# Patient Record
Sex: Female | Born: 1970 | Race: Black or African American | Hispanic: No | Marital: Single | Smoking: Never smoker
Health system: Southern US, Community
[De-identification: ages and names within clinical notes are randomized; demographics above are authoritative.]

## PROBLEM LIST (undated history)

## (undated) DIAGNOSIS — Z9221 Personal history of antineoplastic chemotherapy: Secondary | ICD-10-CM

## (undated) DIAGNOSIS — Z923 Personal history of irradiation: Secondary | ICD-10-CM

## (undated) DIAGNOSIS — C50919 Malignant neoplasm of unspecified site of unspecified female breast: Secondary | ICD-10-CM

## (undated) DIAGNOSIS — Z8589 Personal history of malignant neoplasm of other organs and systems: Secondary | ICD-10-CM

## (undated) DIAGNOSIS — Z801 Family history of malignant neoplasm of trachea, bronchus and lung: Secondary | ICD-10-CM

## (undated) DIAGNOSIS — D649 Anemia, unspecified: Secondary | ICD-10-CM

## (undated) DIAGNOSIS — J302 Other seasonal allergic rhinitis: Secondary | ICD-10-CM

## (undated) DIAGNOSIS — T8859XA Other complications of anesthesia, initial encounter: Secondary | ICD-10-CM

## (undated) DIAGNOSIS — C801 Malignant (primary) neoplasm, unspecified: Secondary | ICD-10-CM

## (undated) DIAGNOSIS — Z807 Family history of other malignant neoplasms of lymphoid, hematopoietic and related tissues: Secondary | ICD-10-CM

## (undated) DIAGNOSIS — Z803 Family history of malignant neoplasm of breast: Secondary | ICD-10-CM

## (undated) DIAGNOSIS — T4145XA Adverse effect of unspecified anesthetic, initial encounter: Secondary | ICD-10-CM

## (undated) DIAGNOSIS — E039 Hypothyroidism, unspecified: Secondary | ICD-10-CM

## (undated) DIAGNOSIS — Z8042 Family history of malignant neoplasm of prostate: Secondary | ICD-10-CM

## (undated) DIAGNOSIS — Z8052 Family history of malignant neoplasm of bladder: Secondary | ICD-10-CM

## (undated) DIAGNOSIS — K219 Gastro-esophageal reflux disease without esophagitis: Secondary | ICD-10-CM

## (undated) DIAGNOSIS — I10 Essential (primary) hypertension: Secondary | ICD-10-CM

## (undated) HISTORY — DX: Personal history of malignant neoplasm of other organs and systems: Z85.89

## (undated) HISTORY — PX: CHOLECYSTECTOMY: SHX55

## (undated) HISTORY — PX: TUBAL LIGATION: SHX77

## (undated) HISTORY — DX: Malignant neoplasm of unspecified site of unspecified female breast: C50.919

## (undated) HISTORY — DX: Family history of malignant neoplasm of prostate: Z80.42

## (undated) HISTORY — PX: NECK LESION BIOPSY: SHX2078

## (undated) HISTORY — DX: Family history of malignant neoplasm of trachea, bronchus and lung: Z80.1

## (undated) HISTORY — DX: Family history of malignant neoplasm of bladder: Z80.52

## (undated) HISTORY — DX: Essential (primary) hypertension: I10

## (undated) HISTORY — DX: Family history of other malignant neoplasms of lymphoid, hematopoietic and related tissues: Z80.7

## (undated) HISTORY — DX: Family history of malignant neoplasm of breast: Z80.3

---

## 1995-01-02 DIAGNOSIS — C801 Malignant (primary) neoplasm, unspecified: Secondary | ICD-10-CM

## 1995-01-02 HISTORY — PX: NECK SURGERY: SHX720

## 1995-01-02 HISTORY — DX: Malignant (primary) neoplasm, unspecified: C80.1

## 2016-02-01 ENCOUNTER — Other Ambulatory Visit: Payer: Self-pay | Admitting: Specialist

## 2016-02-01 DIAGNOSIS — N6320 Unspecified lump in the left breast, unspecified quadrant: Secondary | ICD-10-CM

## 2016-02-01 DIAGNOSIS — R928 Other abnormal and inconclusive findings on diagnostic imaging of breast: Secondary | ICD-10-CM

## 2016-02-09 ENCOUNTER — Ambulatory Visit
Admission: RE | Admit: 2016-02-09 | Discharge: 2016-02-09 | Disposition: A | Discharge status: Home / Self Care | Payer: BLUE CROSS/BLUE SHIELD | Source: Ambulatory Visit | Attending: Specialist | Admitting: Specialist

## 2016-02-09 ENCOUNTER — Other Ambulatory Visit: Payer: Self-pay | Admitting: Specialist

## 2016-02-09 DIAGNOSIS — N6320 Unspecified lump in the left breast, unspecified quadrant: Secondary | ICD-10-CM

## 2016-02-09 DIAGNOSIS — R928 Other abnormal and inconclusive findings on diagnostic imaging of breast: Secondary | ICD-10-CM

## 2016-02-13 ENCOUNTER — Telehealth: Payer: Self-pay | Admitting: *Deleted

## 2016-02-13 NOTE — Telephone Encounter (Signed)
Confirmed [facility] for 1215 at 02/15/16 .  Instructions and contact information given.

## 2016-02-14 ENCOUNTER — Other Ambulatory Visit: Payer: Self-pay | Admitting: *Deleted

## 2016-02-14 DIAGNOSIS — C50412 Malignant neoplasm of upper-outer quadrant of left female breast: Secondary | ICD-10-CM | POA: Insufficient documentation

## 2016-02-14 DIAGNOSIS — Z171 Estrogen receptor negative status [ER-]: Principal | ICD-10-CM

## 2016-02-15 ENCOUNTER — Ambulatory Visit (HOSPITAL_BASED_OUTPATIENT_CLINIC_OR_DEPARTMENT_OTHER): Payer: BLUE CROSS/BLUE SHIELD | Admitting: Hematology and Oncology

## 2016-02-15 ENCOUNTER — Other Ambulatory Visit (HOSPITAL_BASED_OUTPATIENT_CLINIC_OR_DEPARTMENT_OTHER): Payer: BLUE CROSS/BLUE SHIELD

## 2016-02-15 ENCOUNTER — Ambulatory Visit: Payer: Self-pay | Admitting: Surgery

## 2016-02-15 ENCOUNTER — Encounter: Payer: Self-pay | Admitting: Hematology and Oncology

## 2016-02-15 ENCOUNTER — Ambulatory Visit: Payer: BLUE CROSS/BLUE SHIELD | Attending: Surgery | Admitting: Physical Therapy

## 2016-02-15 ENCOUNTER — Encounter: Payer: Self-pay | Admitting: Physical Therapy

## 2016-02-15 ENCOUNTER — Ambulatory Visit
Admission: RE | Admit: 2016-02-15 | Discharge: 2016-02-15 | Disposition: A | Discharge status: Home / Self Care | Payer: BLUE CROSS/BLUE SHIELD | Source: Ambulatory Visit | Attending: Radiation Oncology | Admitting: Radiation Oncology

## 2016-02-15 VITALS — BP 133/68 | HR 89 | Temp 98.0°F | Resp 20 | Ht 69.0 in | Wt 291.5 lb

## 2016-02-15 DIAGNOSIS — Z171 Estrogen receptor negative status [ER-]: Secondary | ICD-10-CM

## 2016-02-15 DIAGNOSIS — C50412 Malignant neoplasm of upper-outer quadrant of left female breast: Secondary | ICD-10-CM | POA: Insufficient documentation

## 2016-02-15 DIAGNOSIS — M25612 Stiffness of left shoulder, not elsewhere classified: Secondary | ICD-10-CM | POA: Insufficient documentation

## 2016-02-15 DIAGNOSIS — Z170 Estrogen receptor positive status [ER+]: Secondary | ICD-10-CM | POA: Diagnosis not present

## 2016-02-15 DIAGNOSIS — R293 Abnormal posture: Secondary | ICD-10-CM | POA: Diagnosis not present

## 2016-02-15 LAB — COMPREHENSIVE METABOLIC PANEL
ALBUMIN: 3.9 g/dL (ref 3.5–5.0)
ALT: 20 U/L (ref 0–55)
AST: 47 U/L — AB (ref 5–34)
Alkaline Phosphatase: 98 U/L (ref 40–150)
Anion Gap: 13 mEq/L — ABNORMAL HIGH (ref 3–11)
BILIRUBIN TOTAL: 0.39 mg/dL (ref 0.20–1.20)
BUN: 15.6 mg/dL (ref 7.0–26.0)
CO2: 26 meq/L (ref 22–29)
Calcium: 9.8 mg/dL (ref 8.4–10.4)
Chloride: 102 mEq/L (ref 98–109)
Creatinine: 0.9 mg/dL (ref 0.6–1.1)
EGFR: 88 mL/min/{1.73_m2} — AB (ref >90)
GLUCOSE: 95 mg/dL (ref 70–140)
Potassium: 3.6 mEq/L (ref 3.5–5.1)
SODIUM: 141 meq/L (ref 136–145)
TOTAL PROTEIN: 8 g/dL (ref 6.4–8.3)

## 2016-02-15 LAB — CBC WITH DIFFERENTIAL/PLATELET
BASO%: 0.8 % (ref 0.0–2.0)
Basophils Absolute: 0.1 10*3/uL (ref 0.0–0.1)
EOS ABS: 0.1 10*3/uL (ref 0.0–0.5)
EOS%: 2.1 % (ref 0.0–7.0)
HCT: 40.9 % (ref 34.8–46.6)
HEMOGLOBIN: 13.3 g/dL (ref 11.6–15.9)
LYMPH%: 16.7 % (ref 14.0–49.7)
MCH: 26.5 pg (ref 25.1–34.0)
MCHC: 32.4 g/dL (ref 31.5–36.0)
MCV: 81.8 fL (ref 79.5–101.0)
MONO#: 0.5 10*3/uL (ref 0.1–0.9)
MONO%: 7.6 % (ref 0.0–14.0)
NEUT%: 72.8 % (ref 38.4–76.8)
NEUTROS ABS: 4.7 10*3/uL (ref 1.5–6.5)
Platelets: 315 10*3/uL (ref 145–400)
RBC: 5 10*6/uL (ref 3.70–5.45)
RDW: 16.4 % — AB (ref 11.2–14.5)
WBC: 6.5 10*3/uL (ref 3.9–10.3)
lymph#: 1.1 10*3/uL (ref 0.9–3.3)

## 2016-02-15 NOTE — Therapy (Addendum)
[place], [place], [address] Phone: [phone]   Fax:  [phone]  Physical Therapy Evaluation  Patient Details  Name: Magdaline Zollars MRN: [id number] Date of Birth: 1970-09-20 Referring Provider: Dr. [name]  Encounter Date: 02/15/2016      PT End of Session - 02/15/16 1529    Visit Number 1   Number of Visits 1   PT Start Time 3704   PT Stop Time 8889  Also saw pt from 1339-1400 and from 1417-1428 for a total of 36 minutes   PT Time Calculation (min) 4 min   Activity Tolerance Patient tolerated treatment well   Behavior During Therapy [facility] for tasks assessed/performed      Past Medical History:  Diagnosis Date  . History of head and neck cancer   . Hypertension     Past Surgical History:  Procedure Laterality Date  . CHOLECYSTECTOMY    . TUBAL LIGATION      There were no vitals filed for this visit.       Subjective Assessment - 02/15/16 1530    Subjective Patient reports she is here today to be seen by her medical team for her newly diagnosed left breast cancer.   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 It measures 1.8 cm and is located in the upper outer quadrant and has a Ki67 of 40%. She had a radical neck dissection in 1998 which resulted in significant loss of cervical and left shoulder ROM and function.   Patient Stated Goals Reduce lymphedema risk and learn post op shoulder ROM HEP            [facility] PT Assessment - 02/15/16 0001      Assessment   Medical Diagnosis Left breast cancer   Referring Provider Dr. [name] [place]   Onset Date/Surgical Date 01/24/16   Hand Dominance Left   Prior Therapy none     Precautions   Precautions Other (comment)   Precaution Comments Active cancer     Restrictions   Weight Bearing Restrictions No      Balance Screen   Has the patient fallen in the past 6 months No   Has the patient had a decrease in activity level because of a fear of falling?  No   Is the patient reluctant to leave their home because of a fear of falling?  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   Vocation Requirements repetitive left arm activities   Leisure She does not exercise     Cognition   Overall Cognitive Status Within Functional Limits for tasks assessed     Posture/Postural Control   Posture/Postural Control Postural limitations   Postural Limitations Rounded Shoulders;Forward head   Posture Comments Compensatory motions with left arm activities     ROM / Strength   AROM / PROM / Strength AROM;Strength     AROM   AROM Assessment Site Shoulder;Cervical   Right/Left Shoulder Right;Left   Right Shoulder Extension 49 Degrees   Right Shoulder Flexion 145 Degrees   Right Shoulder ABduction 145 Degrees  Right Shoulder Internal Rotation 66 Degrees   Right Shoulder External Rotation 88 Degrees   Left Shoulder Extension 48 Degrees   Left Shoulder Flexion 85 Degrees   Left Shoulder ABduction 55 Degrees   Left Shoulder Internal Rotation 47 Degrees   Left Shoulder External Rotation 64 Degrees   Cervical Flexion WNL   Cervical Extension 50% limited   Cervical - Right Side Bend 50% limited   Cervical - Left Side Bend 50% limited   Cervical - Right Rotation 50% limited   Cervical - Left Rotation 25% limited     Strength   Overall Strength Unable to assess  Did not assess left upper extremity due to poor ROM    Overall Strength Comments Left glenohumeral joint sits anteriorly           LYMPHEDEMA/ONCOLOGY  QUESTIONNAIRE - 02/15/16 1527      Type   Cancer Type Left breast cancer     Lymphedema Assessments   Lymphedema Assessments Upper extremities     Right Upper Extremity Lymphedema   10 cm Proximal to Olecranon Process 47.2 cm   Olecranon Process 32 cm   10 cm Proximal to Ulnar Styloid Process 26.9 cm   Just Proximal to Ulnar Styloid Process 19.2 cm   Across Hand at [employer] 22.4 cm   At [place] of 2nd Digit 6.9 cm     Left Upper Extremity Lymphedema   10 cm Proximal to Olecranon Process 47.7 cm   Olecranon Process 34.1 cm   10 cm Proximal to Ulnar Styloid Process 28 cm   Just Proximal to Ulnar Styloid Process 19.9 cm   Across Hand at [employer] 23.1 cm   At [place] of 2nd Digit 6.8 cm                 Patient was instructed today in a home exercise program today for post op shoulder range of motion. These included active assist shoulder flexion in sitting, scapular retraction, wall walking with shoulder abduction, and hands behind head external rotation.  She was encouraged to do these twice a day, holding 3 seconds and repeating 5 times when permitted by her physician.           PT Education - 02/15/16 1528    Education provided Yes   Education Details Lymphedema risk reduction and post op shoulder ROM HEP   Person(s) Educated Patient;Other (comment)  Aunt   Methods Explanation;Demonstration;Handout   Comprehension Returned demonstration;Verbalized understanding              Breast Clinic Goals - 02/15/16 1534      Patient will be able to verbalize understanding of pertinent lymphedema risk reduction practices relevant to her diagnosis specifically related to skin care.   Time 1   Period Days   Status Achieved     Patient will be able to return demonstrate and/or verbalize understanding of the post-op home exercise program related to regaining shoulder range of motion.   Time 1   Period Days   Status Achieved     Patient will be able to  verbalize understanding of the importance of attending the postoperative After Breast Cancer Class for further lymphedema risk reduction education and therapeutic exercise.   Time 1   Period Days   Status Achieved              Plan - 02/15/16 1530    Clinical Impression Statement Patient was diagnosed on 01/24/16 with left Triple negative  grade 3 invasive ductal carcinoma breast cancer. It measures 1.8 cm and is located in the upper outer quadrant and has a Ki67 of 40%. She had a radical neck dissection in 1998 which resulted in significant loss of cervical and left shoulder ROM and function. Her multidisciplinary medical team met prior to her assessments to determine a recommended treatment plan.  She is planning to have genetic testing, a left lumpectomy and sentinel node biopsy, chemotherapy and radiation. She will benefit from post op PT to regain shoulder ROM and try to improve ROM to allow for breast radiation. Due to her previous neck cancer and deficits that remain and as her condition is evolving, her eval is of moderate complexity.   Clinical Impairments Affecting Rehab Potential Radical neck dissection on side of cancer   PT Frequency One time visit   PT Treatment/Interventions Patient/family education;Therapeutic exercise   PT Next Visit Plan Will f/u after surgery to determine PT needs   PT Home Exercise Plan Post op shoulder ROM HEP   Consulted and Agree with Plan of Care Patient;Family member/caregiver   Family Member Consulted Aunt      Patient will benefit from skilled therapeutic intervention in order to improve the following deficits and impairments:  Postural dysfunction, Decreased knowledge of precautions, Pain, Impaired UE functional use, Decreased range of motion  Visit Diagnosis: Abnormal posture - Plan: PT plan of care cert/re-cert  Stiffness of left shoulder, not elsewhere classified - Plan: PT plan of care cert/re-cert  Carcinoma of upper-outer quadrant of  left breast in female, estrogen receptor negative ([place])   Patient will follow up at outpatient cancer rehab if needed following surgery.  If the patient requires physical therapy at that time, a specific plan will be dictated and sent to the referring physician for approval. The patient was educated today on appropriate basic range of motion exercises to begin post operatively and the importance of attending the After Breast Cancer class following surgery.  Patient was educated today on lymphedema risk reduction practices as it pertains to recommendations that will benefit the patient immediately following surgery.  She verbalized good understanding.  No additional physical therapy is indicated at this time.      Problem List Patient Active Problem List   Diagnosis Date Noted  . Malignant neoplasm of upper-outer quadrant of left breast in female, estrogen receptor negative ([place]) 02/14/2016    Annia Friendly, PT 02/15/16 3:51 PM  [place], [place], [address] Phone: [phone]   Fax:  [phone]  Name: Arora Coakley MRN: [id number] Date of Birth: Oct 25, 1970

## 2016-02-15 NOTE — Progress Notes (Signed)
Radiation Oncology         [phone] ________________________________  Name: Carla Pierce MRN: [id number]  Date: 02/15/2016  DOB: [birth year]  HY:[id number],IONGEX, DO  [name], MD     REFERRING PHYSICIAN: [name], MD   DIAGNOSIS: The encounter diagnosis was Malignant neoplasm of upper-outer quadrant of left breast in female, estrogen receptor negative ([place]). Clinical stage IA (T1cN0) grade 3 invasive ductal carcinoma of the left breast (triple negative)  HISTORY OF PRESENT ILLNESS: Carla Pierce is a 46 y.o. female seen seen in multidisciplinary breast clinic for triple negative invasive ductal carcinoma of the left breast. The patient had a screening detected left breast mass at [facility] in [place], [place]. Diagnostic mammogram and US of the left breast revealed an UOQ mass in the 2:00 position measuring 1.8 x 1.4 x 1.3 cm and a left axillary lymph node measuring 0.5 cm. The patient underwent biopsies on 02/09/16. Biopsy of the left breast 2:00 position 16 cm from the nipple revealed grade 3 invasive ductal carcinoma (ER 0% -, PR 0% -, HER2 -, Ki67 40%) and a biopsied left axillary lymph node was negative. She comes today to discuss the options for her treatment.  PREVIOUS RADIATION THERAPY: Yes  1998: The patient had left neck cancer treated with neoadjuvant chemotherapy, surgery, adjuvant chemotherapy, and then 6 weeks of adjuvant radiation.   PAST MEDICAL HISTORY:  Past Medical History:  Diagnosis Date  . History of head and neck cancer   . Hypertension        PAST SURGICAL HISTORY: Past Surgical History:  Procedure Laterality Date  . CHOLECYSTECTOMY    . TUBAL LIGATION       FAMILY HISTORY:  Family History  Problem Relation Age of Onset  . Breast cancer Mother   . Breast cancer Maternal Grandmother   . Lung cancer Maternal Grandfather      SOCIAL HISTORY:  reports that she has never smoked. She does not have any smokeless tobacco history on file.  She reports that she drinks alcohol. She reports that she does not use drugs. The patient is single and lives in [place], [place]. She has a son who's attending [employer]. 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   ALLERGIES: Patient has no known allergies.   MEDICATIONS:  Current Outpatient Prescriptions  Medication Sig Dispense Refill  . AMLODIPINE BESYLATE PO Take 10 mg by mouth.    . Amlodipine-Valsartan-HCTZ 10-320-25 MG TABS Take 1 tablet by mouth.    . cholecalciferol (VITAMIN D) 1000 units tablet Take 1,000 Units by mouth daily.    . ferrous gluconate (FERGON) 324 MG tablet Take 324 mg by mouth daily as needed.    Marland Kitchen FLUTICASONE PROPIONATE HFA IN Inhale 1 spray into the lungs as needed.    Marland Kitchen levothyroxine (SYNTHROID, LEVOTHROID) 200 MCG tablet Take 200 mcg by mouth daily before breakfast.    . levothyroxine (SYNTHROID, LEVOTHROID) 50 MCG tablet Take 50 mcg by mouth daily before breakfast.     No current facility-administered medications for this encounter.      REVIEW OF SYSTEMS: On review of systems, the patient reports that she is doing well overall. She denies any chest pain, shortness of breath, cough, fevers, chills, night sweats, unintended weight changes. She denies any bowel or bladder disturbances, and denies abdominal pain, nausea or vomiting. She denies any new musculoskeletal or joint aches or pains. A complete review of systems is obtained and is otherwise negative.   PHYSICAL EXAM:  Wt  Readings from Last 3 Encounters:  02/15/16 291 lb 8 oz (132.2 kg)   Temp Readings from Last 3 Encounters:  02/15/16 98 F (36.7 C) (Oral)   BP Readings from Last 3 Encounters:  02/15/16 133/68   Pulse Readings from Last 3 Encounters:  02/15/16 89    In general this is a well appearing African-American female in no acute distress. She is alert and oriented x4 and appropriate throughout the examination. HEENT reveals that the patient is normocephalic, atraumatic.  EOMs are intact. PERRLA. Skin is intact without any evidence of gross lesions. Cardiovascular exam reveals a regular rate and rhythm, no clicks rubs or murmurs are auscultated. Chest is clear to auscultation bilaterally. Lymphatic assessment is performed and does not reveal any adenopathy in the cervical, supraclavicular, axillary, or inguinal chains. Bilateral breast exam reveals fullness in the left breast along the 2 o'clock position. No abdomen has active bowel sounds in all quadrants and is intact. The abdomen is soft, non tender, non distended. Lower extremities are negative for pretibial pitting edema, deep calf tenderness, cyanosis or clubbing.   ECOG = 0  0 - Asymptomatic (Fully active, able to carry on all predisease activities without restriction)  1 - Symptomatic but completely ambulatory (Restricted in physically strenuous activity but ambulatory and able to carry out work of a light or sedentary nature. For example, light housework, office work)  2 - Symptomatic, <50% in bed during the day (Ambulatory and capable of all self care but unable to carry out any work activities. Up and about more than 50% of waking hours)  3 - Symptomatic, >50% in bed, but not bedbound (Capable of only limited self-care, confined to bed or chair 50% or more of waking hours)  4 - Bedbound (Completely disabled. Cannot carry on any self-care. Totally confined to bed or chair)  5 - Death   [name] MM, Creech RH, Tormey DC, et al. [phone]). "Toxicity and response criteria of the [facility] Group". [place] Oncol. 5 (6): 649-55    LABORATORY DATA:  Lab Results  Component Value Date   WBC 6.5 02/15/2016   HGB 13.3 02/15/2016   HCT 40.9 02/15/2016   MCV 81.8 02/15/2016   PLT 315 02/15/2016   Lab Results  Component Value Date   NA 141 02/15/2016   K 3.6 02/15/2016   CO2 26 02/15/2016   Lab Results  Component Value Date   ALT 20 02/15/2016   AST 47 (H) 02/15/2016   ALKPHOS 98  02/15/2016   BILITOT 0.39 02/15/2016      RADIOGRAPHY: Mm Clip Placement Left  Result Date: 02/09/2016 CLINICAL DATA:  Evaluate marker placement EXAM: DIAGNOSTIC LEFT MAMMOGRAM POST ULTRASOUND BIOPSY COMPARISON:  Previous exam(s). FINDINGS: Mammographic images were obtained following ultrasound guided biopsy of a borderline left axillary lymph node and a left breast mass. The ribbon shaped biopsy marker is within the biopsied left breast mass. The hydro mark is not included on this film the was clearly seen at the time of appointment. IMPRESSION: Appropriate placement of biopsy clip within the biopsied left breast mass. Final Assessment: Post Procedure Mammograms for Marker Placement Electronically Signed   By: Dorise Bullion III M.D   On: 02/09/2016 12:27   [place] Lt Breast Bx W Loc Dev 1st Lesion Img Bx Spec US Guide  Result Date: 02/09/2016 CLINICAL DATA:  Left breast mass EXAM: ULTRASOUND GUIDED LEFT BREAST CORE NEEDLE BIOPSY COMPARISON:  Previous exam(s). FINDINGS: I met with the patient and we  discussed the procedure of ultrasound-guided biopsy, including benefits and alternatives. We discussed the high likelihood of a successful procedure. We discussed the risks of the procedure, including infection, bleeding, tissue injury, clip migration, and inadequate sampling. Informed written consent was given. The usual time-out protocol was performed immediately prior to the procedure. Using sterile technique and 1% Lidocaine as local anesthetic, under direct ultrasound visualization, a 12 gauge spring-loaded device was used to perform biopsy of a left breast mass using a lateral approach. At the conclusion of the procedure a tissue marker clip was deployed into the biopsy cavity. Follow up 2 view mammogram was performed and dictated separately. IMPRESSION: Ultrasound guided biopsy of a left breast mass. No apparent complications. Electronically Signed   By: Dorise Bullion III M.D   On: 02/09/2016 11:46   [place]  Lt Breast Bx W Loc Dev Ea Add Lesion Img Bx Spec US Guide  Result Date: 02/09/2016 CLINICAL DATA:  Borderline left axillary lymph node EXAM: ULTRASOUND GUIDED CORE NEEDLE BIOPSY OF A LEFT AXILLARY NODE COMPARISON:  Previous exam(s). FINDINGS: I met with the patient and we discussed the procedure of ultrasound-guided biopsy, including benefits and alternatives. We discussed the high likelihood of a successful procedure. We discussed the risks of the procedure, including infection, bleeding, tissue injury, clip migration, and inadequate sampling. Informed written consent was given. The usual time-out protocol was performed immediately prior to the procedure. Using sterile technique and 1% Lidocaine as local anesthetic, under direct ultrasound visualization, a 14 gauge spring-loaded device was used to perform biopsy of a left axillary lymph node using a lateral approach. At the conclusion of the procedure a HydroMARK tissue marker clip was deployed into the biopsy cavity. Follow up 2 view mammogram was performed and dictated separately. IMPRESSION: Ultrasound guided biopsy of a borderline left axillary lymph node. No apparent complications. Electronically Signed   By: Dorise Bullion III M.D   On: 02/09/2016 11:47       IMPRESSION/PLAN: 1. Clinical stage IA (T1cN0) grade 3 invasive ductal carcinoma of the left breast (triple negative). Dr. [name] discussed the pathology findings and reviewed the nature of triple negative invasive ductal carcinoma. The consensus from the breast conference include breast conservation with lumpectomy and sentinel mapping. Followed by this, she will proceed with adjuvant chemotherapy. The patient's course would then be followed by external  radiotherapy. We discussed the risks, benefits, short, and long term effects of radiotherapy. Dr. [name] discussed the delivery and logistics of radiotherapy. We discussed the use of cardiac sparing with deep inspiration breath hold if needed. The  patient is interested in proceeding. We will see her back about 2-4 weeks after chemotherapy to move forward with the simulation and planning process and anticipate starting radiotherapy the following week. 2. Possible genetic predisposition to malignancy. Given the patient's family and personal history, she will be referred to genetics consultation.  The above documentation reflects my direct findings during this shared patient visit. Please see the separate note by Dr. [name] on this date for the remainder of the patient's plan of care.    Carola Rhine, PAC  This document serves as a record of services personally performed by Shona Simpson, PA-C and [name], MD. It was created on their behalf by Darcus Austin, a trained medical scribe. The creation of this record is based on the scribe's personal observations and the providers' statements to them. This document has been checked and approved by the attending provider.

## 2016-02-15 NOTE — Progress Notes (Signed)
[facility] NOTE  Patient Care Team: [name], DO as PCP - General (Family Medicine) [name], MD as Consulting Physician (General Surgery) [name], MD as Consulting Physician (Hematology and Oncology) [name], MD as Consulting Physician (Radiation Oncology)  CHIEF COMPLAINTS/PURPOSE OF CONSULTATION:  Newly diagnosed breast cancer  HISTORY OF PRESENTING ILLNESS:  Carla Pierce 46 y.o. female is here because of recent diagnosis of left breast cancer. Patient had a screening mammogram that detected a left breast mass in the upper outer quadrant 2:00 position measuring 1.8 cm. There was a 5 mm left axillary lymph node which was biopsy-proven to be benign. 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 Patient was presented this morning at the multidisciplinary tumor board and she is here today to discuss a treatment plan.  I reviewed her records extensively and collaborated the history with the patient.  SUMMARY OF ONCOLOGIC HISTORY:   Malignant neoplasm of upper-outer quadrant of left breast in female, estrogen receptor negative ([facility])   02/09/2016 Initial Diagnosis    Left breast biopsy 2:00: IDC, left axillary lymph node biopsy negative, grade 3, ER 0%, PR 0%, Ki-67 40%, HER-2 negative ratio 1.31, screening detected left breast mass 1.8 cm, left axillary lymph node 5 mm benign, T1 CN 0 stage IA clinical stage      MEDICAL HISTORY:  Past Medical History:  Diagnosis Date  . History of head and neck cancer   . Hypertension     SURGICAL HISTORY: Past Surgical History:  Procedure Laterality Date  . CHOLECYSTECTOMY    . TUBAL LIGATION      SOCIAL HISTORY: Social History   Social History  . Marital status: Single    Spouse name: N/A  . Number of children: N/A  . Years of education: N/A   Occupational History  . Not on file.   Social History Main Topics   . Smoking status: Never Smoker  . Smokeless tobacco: Not on file  . Alcohol use Yes     Comment: once a month  . Drug use: No  . Sexual activity: Not on file   Other Topics Concern  . Not on file   Social History Narrative  . No narrative on file    FAMILY HISTORY: Family History  Problem Relation Age of Onset  . Breast cancer Mother   . Breast cancer Maternal Grandmother   . Lung cancer Maternal Grandfather     ALLERGIES:  has No Known Allergies.  MEDICATIONS:  Current Outpatient Prescriptions  Medication Sig Dispense Refill  . AMLODIPINE BESYLATE PO Take 10 mg by mouth.    . Amlodipine-Valsartan-HCTZ 10-320-25 MG TABS Take 1 tablet by mouth.    . cholecalciferol (VITAMIN D) 1000 units tablet Take 1,000 Units by mouth daily.    . ferrous gluconate (FERGON) 324 MG tablet Take 324 mg by mouth daily as needed.    Marland Kitchen FLUTICASONE PROPIONATE HFA IN Inhale 1 spray into the lungs as needed.    Marland Kitchen levothyroxine (SYNTHROID, LEVOTHROID) 200 MCG tablet Take 200 mcg by mouth daily before breakfast.    . levothyroxine (SYNTHROID, LEVOTHROID) 50 MCG tablet Take 50 mcg by mouth daily before breakfast.     No current facility-administered medications for this visit.     REVIEW OF SYSTEMS:   Constitutional: Denies fevers, chills or abnormal night sweats Eyes: Denies blurriness of vision, double vision or watery eyes  Ears, nose, mouth, throat, and face: Denies mucositis or sore throat Respiratory: Denies cough, dyspnea or wheezes Cardiovascular: Denies palpitation, chest discomfort or lower extremity swelling Gastrointestinal:  Denies nausea, heartburn or change in bowel habits Skin: Denies abnormal skin rashes Lymphatics: Denies new lymphadenopathy or easy bruising Neurological:Denies numbness, tingling or new weaknesses Behavioral/Psych: Mood is stable, no new changes  Breast:  Denies any palpable lumps or discharge All other systems were reviewed with the patient and are  negative.  PHYSICAL EXAMINATION: ECOG PERFORMANCE STATUS: 1 - Symptomatic but completely ambulatory  Vitals:   02/15/16 1240  BP: 133/68  Pulse: 89  Resp: 20  Temp: 98 F (36.7 C)   Filed Weights   02/15/16 1240  Weight: 291 lb 8 oz (132.2 kg)    GENERAL:alert, no distress and comfortable SKIN: skin color, texture, turgor are normal, no rashes or significant lesions EYES: normal, conjunctiva are pink and non-injected, sclera clear OROPHARYNX:no exudate, no erythema and lips, buccal mucosa, and tongue normal  NECK: supple, thyroid normal size, non-tender, without nodularity LYMPH:  no palpable lymphadenopathy in the cervical, axillary or inguinal LUNGS: clear to auscultation and percussion with normal breathing effort HEART: regular rate & rhythm and no murmurs and no lower extremity edema ABDOMEN:abdomen soft, non-tender and normal bowel sounds Musculoskeletal:no cyanosis of digits and no clubbing  PSYCH: alert & oriented x 3 with fluent speech NEURO: no focal motor/sensory deficits BREAST:No palpable nodules in breast. No palpable axillary or supraclavicular lymphadenopathy (exam performed in the presence of a chaperone)   LABORATORY DATA:  I have reviewed the data as listed Lab Results  Component Value Date   WBC 6.5 02/15/2016   HGB 13.3 02/15/2016   HCT 40.9 02/15/2016   MCV 81.8 02/15/2016   PLT 315 02/15/2016   Lab Results  Component Value Date   NA 141 02/15/2016   K 3.6 02/15/2016   CO2 26 02/15/2016    RADIOGRAPHIC STUDIES: I have personally reviewed the radiological reports and agreed with the findings in the report.  ASSESSMENT AND PLAN:  Malignant neoplasm of upper-outer quadrant of left breast in female, estrogen receptor negative ([facility]) Left breast biopsy 2:00: IDC, left axillary lymph node biopsy negative, grade 3, ER 0%, PR 0%, Ki-67 40%, HER-2 negative ratio 1.31, screening detected left breast mass 1.8 cm, left axillary lymph node 5 mm benign, T1  CN 0 stage IA clinical stage  Pathology and radiology counseling: Discussed with the patient, the details of pathology including the type of breast cancer,the clinical staging, the significance of ER, PR and HER-2/neu receptors and the implications for treatment. After reviewing the pathology in detail, we proceeded to discuss the different treatment options between surgery, radiation, chemotherapy.  Recommendation: 1. Breast conserving surgery with sentinel lymph node biopsy 2. adjuvant chemotherapy with dose dense Adriamycin and Cytoxan 4 followed by weekly Taxol 12 3. Followed by radiation therapy  Chemotherapy Counseling: I discussed the risks and benefits of chemotherapy including the risks of nausea/ vomiting, risk of infection from low WBC count, fatigue due to chemo or anemia, bruising or bleeding due to low platelets, mouth sores, loss/ change in taste and decreased appetite. Liver and kidney function will be monitored through out chemotherapy as abnormalities in liver and kidney function may be a side effect of treatment. Cardiac dysfunction due to Adriamycin was discussed in detail. Risk of permanent bone marrow dysfunction and leukemia due to chemo were also discussed.  Plan: 1. Port placement during breast conserving surgery 2. echocardiogram of  the heart 3. Chemotherapy class  Return to clinic after surgery to discuss the final treatment plan     All questions were answered. The patient knows to call the clinic with any problems, questions or concerns.    Rulon Eisenmenger, MD 02/15/16

## 2016-02-15 NOTE — Progress Notes (Signed)
Nutrition Assessment  Reason for Assessment:  Pt seen in Breast Clinic  ASSESSMENT:   46 year old female with new diagnosis of left breast cancer.  Past medical history of neck cancer treated at [facility], [place] in 1997. Patient reports received radiation treatments and has seen a SLP.    Reports no problems swallowing or chewing foods.  Has dry mouth but manageable with sipping on liquids frequently.  Reports normal appetite.  Medications:  reviewed  Labs: reviewed  Anthropometrics:   Height: 69 inches Weight: 291 lb BMI: 43   NUTRITION DIAGNOSIS: Food and nutrition related knowledge deficit related to new diagnosis of breast cancer as evidenced by no prior need for nutrition related information.  INTERVENTION:   Discussed and provided packet of information regarding nutritional tips for breast cancer patients.  Questions answered.  Teachback method used.  Contact information given.    MONITORING, EVALUATION, and GOAL: Pt will consume a healthy plant based diet to maintain lean body mass throughout treatment.   Darrek Leasure B. Zenia Resides, [place], [place] (pager)

## 2016-02-15 NOTE — Addendum Note (Signed)
Addended by: Arbutus Ped C on: 02/15/2016 03:53 PM   Modules accepted: Orders

## 2016-02-15 NOTE — Assessment & Plan Note (Signed)
Left breast biopsy 2:00: IDC, left axillary lymph node biopsy negative, grade 3, ER 0%, PR 0%, Ki-67 40%, HER-2 negative ratio 1.31, screening detected left breast mass 1.8 cm, left axillary lymph node 5 mm benign, T1 CN 0 stage IA clinical stage  Pathology and radiology counseling: Discussed with the patient, the details of pathology including the type of breast cancer,the clinical staging, the significance of ER, PR and HER-2/neu receptors and the implications for treatment. After reviewing the pathology in detail, we proceeded to discuss the different treatment options between surgery, radiation, chemotherapy.  Recommendation: 1. Breast conserving surgery with sentinel lymph node biopsy 2. adjuvant chemotherapy with dose dense Adriamycin and Cytoxan 4 followed by weekly Taxol 12 3. Followed by radiation therapy  Chemotherapy Counseling: I discussed the risks and benefits of chemotherapy including the risks of nausea/ vomiting, risk of infection from low WBC count, fatigue due to chemo or anemia, bruising or bleeding due to low platelets, mouth sores, loss/ change in taste and decreased appetite. Liver and kidney function will be monitored through out chemotherapy as abnormalities in liver and kidney function may be a side effect of treatment. Cardiac dysfunction due to Adriamycin was discussed in detail. Risk of permanent bone marrow dysfunction and leukemia due to chemo were also discussed.  Plan: 1. Port placement during breast conserving surgery 2. echocardiogram of the heart 3. Chemotherapy class  Return to clinic after surgery to discuss the final treatment plan

## 2016-02-15 NOTE — Patient Instructions (Signed)

## 2016-02-15 NOTE — H&P (Signed)
Carla Pierce 02/15/2016 7:36 AM Location: [facility] Patient #: [id number] DOB: Sep 22, 1970 Undefined / Language: Cleophus Pierce / Race: Black or African American Female  History of Present Illness [name] A. Cabot Cromartie MD; 02/15/2016 2:19 PM) Patient words: patient sent at the request of Dr. [name] for 1.8 cm left breast mass in the upper outer quadrant. This was picked up on screening mammogram. 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 Left axilla is negative. She gives no family history of breast cancer. 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 Patient denies aast mass.y of breast pain, nipple discharge or breast mass.  The patient is a 46 year old female.   Past Surgical History [name], RN; 02/15/2016 7:36 AM) Breast Biopsy Left.  Diagnostic Studies History [name], RN; 02/15/2016 7:36 AM) Colonoscopy never Mammogram within last year Pap Smear 1-5 years ago  Medication History [name], RN; 02/15/2016 7:36 AM) Medications Reconciled  Social History [name], RN; 02/15/2016 7:36 AM) Alcohol use Occasional alcohol use. Caffeine use Carbonated beverages, Coffee, Tea. No drug use Tobacco use Never smoker.  Family History [name], RN; 02/15/2016 7:36 AM) Breast Cancer Family Members In General, Mother. Cancer Family Members In General. Diabetes Mellitus Family Members In General. Respiratory Condition Family Members In General.  Pregnancy / Birth History [name], RN; 02/15/2016 7:36 AM) Age at menarche 31 years. Contraceptive History Depo-provera, Oral contraceptives. Gravida 1 Irregular periods Maternal age 25-30 Para 1  Other Problems [name], RN; 02/15/2016 7:36 AM) Back Pain Cancer Gastroesophageal Reflux Disease Heart murmur High blood  pressure Thyroid Disease     Review of Systems [name] RN; 02/15/2016 7:36 AM) General Present- Weight Gain and Weight Loss. Not Present- Appetite Loss, Chills, Fatigue, Fever and Night Sweats. Skin Not Present- Change in Wart/Mole, Dryness, Hives, Jaundice, New Lesions, Non-Healing Wounds, Rash and Ulcer. HEENT Present- Seasonal Allergies and Wears glasses/contact lenses. Not Present- Earache, Hearing Loss, Hoarseness, Nose Bleed, Oral Ulcers, Ringing in the Ears, Sinus Pain, Sore Throat, Visual Disturbances and Yellow Eyes. Respiratory Present- Snoring. Not Present- Bloody sputum, Chronic Cough, Difficulty Breathing and Wheezing. Breast Present- Breast Mass. Not Present- Breast Pain, Nipple Discharge and Skin Changes. Cardiovascular Present- Leg Cramps. Not Present- Chest Pain, Difficulty Breathing Lying Down, Palpitations, Rapid Heart Rate, Shortness of Breath and Swelling of Extremities. Gastrointestinal Present- Difficulty Swallowing and Excessive gas. Not Present- Abdominal Pain, Bloating, Bloody Stool, Change in Bowel Habits, Chronic diarrhea, Constipation, Gets full quickly at meals, Hemorrhoids, Indigestion, Nausea, Rectal Pain and Vomiting. Female Genitourinary Not Present- Frequency, Nocturia, Painful Urination, Pelvic Pain and Urgency. Musculoskeletal Present- Back Pain. Not Present- Joint Pain, Joint Stiffness, Muscle Pain, Muscle Weakness and Swelling of Extremities. Neurological Not Present- Decreased Memory, Fainting, Headaches, Numbness, Seizures, Tingling, Tremor, Trouble walking and Weakness. Psychiatric Present- Change in Sleep Pattern. Not Present- Anxiety, Bipolar, Depression, Fearful and Frequent crying. Endocrine Present- Cold Intolerance. Not Present- Excessive Hunger, Hair Changes, Heat Intolerance, Hot flashes and New Diabetes. Hematology Not Present- Blood Thinners, Easy Bruising, Excessive bleeding, Gland problems, HIV and Persistent Infections.   Physical  Exam ([name] A. [name] MD; 02/15/2016 2:20 PM)  General Mental Status-Alert. General Appearance-Consistent with stated age. Hydration-Well hydrated. Voice-Normal.  Head and Neck Note: large left neck scar with contracture.  Breast Breast - Left-Symmetric, Non Tender, No Biopsy scars, no Dimpling, No Inflammation, No Lumpectomy scars, No Mastectomy  scars, No Peau d' Orange. Breast - Right-Symmetric, Non Tender, No Biopsy scars, no Dimpling, No Inflammation, No Lumpectomy scars, No Mastectomy scars, No Peau d' Orange. Breast Lump-No Palpable Breast Mass.  Cardiovascular Cardiovascular examination reveals -normal heart sounds, regular rate and rhythm with no murmurs and normal pedal pulses bilaterally.  Neurologic Neurologic evaluation reveals -alert and oriented x 3 with no impairment of recent or remote memory. Mental Status-Normal.  Musculoskeletal Normal Exam - Left-Upper Extremity Strength Normal and Lower Extremity Strength Normal. Normal Exam - Right-Upper Extremity Strength Normal and Lower Extremity Strength Normal.  Lymphatic Head & Neck  General Head & Neck Lymphatics: Bilateral - Description - Normal. Axillary  General Axillary Region: Bilateral - Description - Normal. Tenderness - Non Tender.    Assessment & Plan ([name] A. [name] MD; 02/15/2016 2:23 PM)  BREAST CANCER, LEFT (C50.912) Impression: discussed breasts conservation versus mastectomy with reconstruction.patient is opted for left breast seed localized partial mastectomy with left axillary sentinel lymph node mapping. Risk of lumpectomy include bleeding, infection, seroma, more surgery, use of seed/wire, wound care, cosmetic deformity and the need for other treatments, death , blood clots, death. Pt agrees to proceed. Risk of sentinel lymph node mapping include bleeding, infection, lymphedema, shoulder pain. stiffness, dye allergy. cosmetic deformity , blood clots, death, need for  more surgery. Pt agres to proceed.  patient will require postoperative chemotherapy. I discussed port placement on the right side. I discussed the use of ultrasound, C-arm and internal jugular vein on the right. Pt requires port placement for chemotherapy. Risk include bleeding, infection, pneumothorax, hemothorax, mediastinal injury, nerve injury , blood vessel injury, strke, blood clots, death, migration. embolization and need for additional procedures. Pt agrees to proceed.  Current Plans You are being scheduled for surgery- Our schedulers will call you.  You should hear from our office's scheduling department within 5 working days about the location, date, and time of surgery. We try to make accommodations for patient's preferences in scheduling surgery, but sometimes the OR schedule or the surgeon's schedule prevents [place] from making those accommodations.  If you have not heard from our office [phone]) in 5 working days, call the office and ask for your surgeon's nurse.  If you have other questions about your diagnosis, plan, or surgery, call the office and ask for your surgeon's nurse.  Pt Education - [facility] Breast Cancer Information Given - [facility] "Breast Journey" Package Pt Education - Pamphlet Given - Breast Biopsy: discussed with patient and provided information. We discussed the staging and pathophysiology of breast cancer. We discussed all of the different options for treatment for breast cancer including surgery, chemotherapy, radiation therapy, Herceptin, and antiestrogen therapy. We discussed a sentinel lymph node biopsy as she does not appear to having lymph node involvement right now. We discussed the performance of that with injection of radioactive tracer and blue dye. We discussed that she would have an incision underneath her axillary hairline. We discussed that there is a bout a 10-20% chance of having a positive node with a sentinel lymph node biopsy and we will await the  permanent pathology to make any other first further decisions in terms of her treatment. One of these options might be to return to the operating room to perform an axillary lymph node dissection. We discussed about a 1-2% risk lifetime of chronic shoulder pain as well as lymphedema associated with a sentinel lymph node biopsy. We discussed the options for treatment of the breast cancer which included lumpectomy versus a mastectomy.  We discussed the performance of the lumpectomy with a wire placement. We discussed a 10-20% chance of a positive margin requiring reexcision in the operating room. We also discussed that she may need radiation therapy or antiestrogen therapy or both if she undergoes lumpectomy. We discussed the mastectomy and the postoperative care for that as well. We discussed that there is no difference in her survival whether she undergoes lumpectomy with radiation therapy or antiestrogen therapy versus a mastectomy. There is a slight difference in the local recurrence rate being 3-5% with lumpectomy and about 1% with a mastectomy. We discussed the risks of operation including bleeding, infection, possible reoperation. She understands her further therapy will be based on what her stages at the time of her operation.  Pt Education - flb breast cancer surgery: discussed with patient and provided information. Pt Education - [facility] Breast Biopsy HCI: discussed with patient and provided information. Pt Education - ABC (After Breast Cancer) Class Info: discussed with patient and provided information. Use of a central venous catheter for intravenous therapy was discussed. Technique of catheter placement using ultrasound and fluoroscopy guidance was discussed. Risks such as bleeding, infection, pneumothorax, catheter occlusion, reoperation, and other risks were discussed. I noted a good likelihood this will help address the problem. Questions were answered. The patient expressed understanding &  wishes to proceed.

## 2016-02-16 ENCOUNTER — Encounter: Payer: Self-pay | Admitting: General Practice

## 2016-02-16 ENCOUNTER — Telehealth: Payer: Self-pay | Admitting: Hematology and Oncology

## 2016-02-16 NOTE — Telephone Encounter (Signed)
Received FMLA paperwork to be completed °

## 2016-02-16 NOTE — Progress Notes (Signed)
[facility] Psychosocial Distress Screening [place] by phone following Breast Multidisciplinary Clinic to introduce [facility] team/resources, reviewing distress screen per protocol.  The patient scored a 7 on the Psychosocial Distress Thermometer which indicates severe distress. Also assessed for distress and other psychosocial needs.   ONCBCN DISTRESS SCREENING 02/16/2016  Screening Type Initial Screening  Distress experienced in past week (1-10) 7  Emotional problem type Nervousness/Anxiety  Referral to support programs Yes   Ms Cauthorn reports very strong support from family (aunts, uncles, cousins, son, SO) and coping tools developed through her cancer experience 20 years ago.  Per pt, she is very pleased with medical team at [facility] and feels encouraged about her dx/tx/px.  Follow up needed: No.  Will send pt a full packet of [place].  Encouraged her to consider taking advantage of free programs while she is here on campus for other appts, particularly due to distance.  Offered phone appts for emotional/spiritual support as well.  She plans to reach out as need/desire arises, but please also page if immediate needs arise.  Thank you.   [place], [place], [facility] Pager [phone] Voicemail [phone]

## 2016-02-17 ENCOUNTER — Telehealth: Payer: Self-pay | Admitting: Hematology and Oncology

## 2016-02-17 NOTE — Telephone Encounter (Signed)
sw pt to confirm 3/7 chemo class at 10 am per LOS

## 2016-02-20 ENCOUNTER — Telehealth: Payer: Self-pay | Admitting: Hematology and Oncology

## 2016-02-20 ENCOUNTER — Telehealth: Payer: Self-pay | Admitting: *Deleted

## 2016-02-20 NOTE — Telephone Encounter (Signed)
sw pt to confirm 3/12 appt at 1230 pm per LOS

## 2016-02-20 NOTE — Telephone Encounter (Signed)
Faxed FMLA paperwork to [place] fax [phone]

## 2016-02-20 NOTE — Telephone Encounter (Signed)
  Oncology Nurse Navigator Documentation  Navigator Location: [facility] (02/20/16 1300)   )Navigator Encounter Type: Telephone;MDC Follow-up (02/20/16 1300) Telephone: Outgoing Call;Clinic/MDC Follow-up (02/20/16 1300)       Genetic Counseling Date: 09/03/13 (Had BRCA1 and BRCA 2 elsewhere - Results were negative) (02/20/16 1300) Genetic Counseling Type: Non-Urgent (02/20/16 1300)                                        Time Spent with Patient: 15 (02/20/16 1300)

## 2016-02-24 ENCOUNTER — Telehealth (HOSPITAL_COMMUNITY): Payer: Self-pay | Admitting: Vascular Surgery

## 2016-02-24 NOTE — Telephone Encounter (Signed)
Left pt detailed message about her NEW PT APPT  W/ db 03/06/16 @ 2 and 3:20

## 2016-02-29 ENCOUNTER — Other Ambulatory Visit: Payer: Self-pay | Admitting: Surgery

## 2016-02-29 DIAGNOSIS — C50412 Malignant neoplasm of upper-outer quadrant of left female breast: Secondary | ICD-10-CM

## 2016-02-29 DIAGNOSIS — Z171 Estrogen receptor negative status [ER-]: Principal | ICD-10-CM

## 2016-03-01 HISTORY — PX: BREAST LUMPECTOMY: SHX2

## 2016-03-05 ENCOUNTER — Telehealth: Payer: Self-pay | Admitting: *Deleted

## 2016-03-06 ENCOUNTER — Encounter (HOSPITAL_BASED_OUTPATIENT_CLINIC_OR_DEPARTMENT_OTHER): Payer: Self-pay | Admitting: *Deleted

## 2016-03-06 ENCOUNTER — Encounter (HOSPITAL_COMMUNITY): Payer: Self-pay

## 2016-03-06 ENCOUNTER — Encounter (HOSPITAL_BASED_OUTPATIENT_CLINIC_OR_DEPARTMENT_OTHER)
Admission: RE | Admit: 2016-03-06 | Discharge: 2016-03-06 | Disposition: A | Discharge status: Home / Self Care | Payer: BLUE CROSS/BLUE SHIELD | Source: Ambulatory Visit | Attending: Surgery | Admitting: Surgery

## 2016-03-06 ENCOUNTER — Ambulatory Visit (HOSPITAL_BASED_OUTPATIENT_CLINIC_OR_DEPARTMENT_OTHER)
Admission: RE | Admit: 2016-03-06 | Discharge: 2016-03-06 | Disposition: A | Discharge status: Home / Self Care | Payer: BLUE CROSS/BLUE SHIELD | Source: Ambulatory Visit | Attending: Family Medicine | Admitting: Family Medicine

## 2016-03-06 ENCOUNTER — Ambulatory Visit (HOSPITAL_COMMUNITY)
Admission: RE | Admit: 2016-03-06 | Discharge: 2016-03-06 | Disposition: A | Discharge status: Home / Self Care | Payer: BLUE CROSS/BLUE SHIELD | Source: Ambulatory Visit | Attending: Cardiology | Admitting: Cardiology

## 2016-03-06 VITALS — BP 126/82 | HR 100 | Ht 69.0 in | Wt 294.8 lb

## 2016-03-06 DIAGNOSIS — C50412 Malignant neoplasm of upper-outer quadrant of left female breast: Secondary | ICD-10-CM | POA: Diagnosis present

## 2016-03-06 DIAGNOSIS — Z171 Estrogen receptor negative status [ER-]: Secondary | ICD-10-CM

## 2016-03-06 DIAGNOSIS — I10 Essential (primary) hypertension: Secondary | ICD-10-CM | POA: Insufficient documentation

## 2016-03-06 LAB — ECHOCARDIOGRAM COMPLETE
HEIGHTINCHES: 69 in
WEIGHTICAEL: 4720 [oz_av]

## 2016-03-06 MED ORDER — PERFLUTREN LIPID MICROSPHERE
1.0000 mL | INTRAVENOUS | Status: AC | PRN
  Filled 2016-03-06: qty 10

## 2016-03-06 NOTE — Progress Notes (Signed)
EKG reviewed with Dr Lissa Hoard. Ok to proceed

## 2016-03-06 NOTE — Progress Notes (Signed)
Boost Breeze given to patient to finish by 0630 DOS. Patient aware and voices understanding.

## 2016-03-06 NOTE — Patient Instructions (Signed)
No change to medication today.  No lab work today.  Once you have scheduled your 3rd round of chemotherapy, please call our office to schedule an appointment and echocardiogram with Dr. Aundra Dubin.

## 2016-03-07 ENCOUNTER — Other Ambulatory Visit: Payer: BLUE CROSS/BLUE SHIELD

## 2016-03-07 NOTE — Progress Notes (Signed)
Oncology: Dr. Lindi Adie  46 yo with history of HTN and prior head and neck cancer presents for cardio-oncology appointment after diagnosis of breast cancer.  Diagnosed 2/18 on left. ER-/PR-/HER2-.  She has lumpectomy planned.  She will get Adriamycin/Cytoxan x 4 cycles then weekly Taxol x 12.  This will be followed by radiation.  No history of cardiac problems.  No chest pain.  No exertional dyspnea.   ECG (personally reviewed): NSR, poor RWP.   PMH: 1. HTN 2. H/o cholecystectomy 3. Head and neck cancer: Surgery, chemo, radiation in 1997.  4. Breast cancer: Diagnosed 2/18 on left. ER-/PR-/HER2-.  She has lumpectomy planned.  She will get Adriamycin/Cytoxan x 4 cycles then weekly Taxol x 12.  This will be followed by radiation. - Echo (3/18): EF 60-65%, mild LVH, normal RV size and systolic function.  Social History   Social History  . Marital status: Single    Spouse name: N/A  . Number of children: N/A  . Years of education: N/A   Occupational History  . Not on file.   Social History Main Topics  . Smoking status: Never Smoker  . Smokeless tobacco: Never Used  . Alcohol use Yes     Comment: once a month  . Drug use: No  . Sexual activity: Yes    Birth control/ protection: Surgical   Other Topics Concern  . Not on file   Social History Narrative  . No narrative on file   Family History  Problem Relation Age of Onset  . Breast cancer Mother   . Breast cancer Maternal Grandmother   . Lung cancer Maternal Grandfather    ROS: All systems reviewed and negative except as per HPI.   Current Outpatient Prescriptions  Medication Sig Dispense Refill  . AMLODIPINE BESYLATE PO Take 10 mg by mouth.    . cholecalciferol (VITAMIN D) 1000 units tablet Take 1,000 Units by mouth daily.    . fluticasone (FLONASE) 50 MCG/ACT nasal spray Place into both nostrils daily.    Marland Kitchen levothyroxine (SYNTHROID, LEVOTHROID) 200 MCG tablet Take 200 mcg by mouth daily before breakfast.    .  levothyroxine (SYNTHROID, LEVOTHROID) 50 MCG tablet Take 50 mcg by mouth daily before breakfast.    . valsartan-hydrochlorothiazide (DIOVAN-HCT) 320-25 MG tablet Take 1 tablet by mouth daily.     No current facility-administered medications for this encounter.    BP 126/82 (BP Location: Right Arm, Patient Position: Sitting, Cuff Size: Normal)   Pulse 100   Ht _0  (1.753 m)   Wt 294 lb 12.8 oz (133.7 kg)   LMP 02/28/2016 Comment: irreg periods  SpO2 98%   BMI 43.53 kg/m  General: NAD Neck: No JVD, no thyromegaly or thyroid nodule.  Lungs: Clear to auscultation bilaterally with normal respiratory effort. CV: Nondisplaced PMI.  Heart regular S1/S2, no S3/S4, no murmur.  No peripheral edema.  No carotid bruit.  Normal pedal pulses.  Abdomen: Soft, nontender, no hepatosplenomegaly, no distention.  Skin: Intact without lesions or rashes.  Neurologic: Alert and oriented x 3.  Psych: Normal affect. Extremities: No clubbing or cyanosis.  HEENT: Normal.   Assessment/Plan: 46 yo with ER-/PR-/HER2- breast cancer.  She will get Adriamycin + Cytoxan x 4 cycles.  We discussed the potential cardiac risk from Adriamycin.  I reviewed today's echo which showed no significant abnormalities.  She will return after cycle 3/4 of Adriamycin for repeat echo.  If that echo is unremarkable, she will have a 3rd echo after 6 months,  to catch any late effects of Adriamycin.   Loralie Champagne 03/07/2016

## 2016-03-09 ENCOUNTER — Ambulatory Visit
Admission: RE | Admit: 2016-03-09 | Discharge: 2016-03-09 | Disposition: A | Discharge status: Home / Self Care | Payer: BLUE CROSS/BLUE SHIELD | Source: Ambulatory Visit | Attending: Surgery | Admitting: Surgery

## 2016-03-09 DIAGNOSIS — C50412 Malignant neoplasm of upper-outer quadrant of left female breast: Secondary | ICD-10-CM

## 2016-03-09 DIAGNOSIS — Z171 Estrogen receptor negative status [ER-]: Principal | ICD-10-CM

## 2016-03-12 ENCOUNTER — Other Ambulatory Visit: Payer: BLUE CROSS/BLUE SHIELD

## 2016-03-12 ENCOUNTER — Telehealth: Payer: Self-pay | Admitting: Hematology and Oncology

## 2016-03-12 NOTE — Telephone Encounter (Signed)
Patient r/s appt due to inclement weather. Patient is aware of new date and time

## 2016-03-13 ENCOUNTER — Ambulatory Visit (HOSPITAL_BASED_OUTPATIENT_CLINIC_OR_DEPARTMENT_OTHER): Payer: BLUE CROSS/BLUE SHIELD | Admitting: Anesthesiology

## 2016-03-13 ENCOUNTER — Ambulatory Visit (HOSPITAL_COMMUNITY): Payer: BLUE CROSS/BLUE SHIELD

## 2016-03-13 ENCOUNTER — Encounter (HOSPITAL_BASED_OUTPATIENT_CLINIC_OR_DEPARTMENT_OTHER)
Admission: RE | Disposition: A | Discharge status: Home / Self Care | Payer: Self-pay | Source: Ambulatory Visit | Attending: Surgery

## 2016-03-13 ENCOUNTER — Ambulatory Visit
Admission: RE | Admit: 2016-03-13 | Discharge: 2016-03-13 | Disposition: A | Discharge status: Home / Self Care | Payer: BLUE CROSS/BLUE SHIELD | Source: Ambulatory Visit | Attending: Surgery | Admitting: Surgery

## 2016-03-13 ENCOUNTER — Ambulatory Visit (HOSPITAL_BASED_OUTPATIENT_CLINIC_OR_DEPARTMENT_OTHER)
Admission: RE | Admit: 2016-03-13 | Discharge: 2016-03-13 | Disposition: A | Discharge status: Home / Self Care | Payer: BLUE CROSS/BLUE SHIELD | Source: Ambulatory Visit | Attending: Surgery | Admitting: Surgery

## 2016-03-13 ENCOUNTER — Encounter (HOSPITAL_BASED_OUTPATIENT_CLINIC_OR_DEPARTMENT_OTHER): Payer: Self-pay | Admitting: *Deleted

## 2016-03-13 ENCOUNTER — Encounter (HOSPITAL_COMMUNITY)
Admission: RE | Admit: 2016-03-13 | Discharge: 2016-03-13 | Disposition: A | Discharge status: Home / Self Care | Payer: BLUE CROSS/BLUE SHIELD | Source: Ambulatory Visit | Attending: Surgery | Admitting: Surgery

## 2016-03-13 DIAGNOSIS — C50412 Malignant neoplasm of upper-outer quadrant of left female breast: Secondary | ICD-10-CM | POA: Insufficient documentation

## 2016-03-13 DIAGNOSIS — Z809 Family history of malignant neoplasm, unspecified: Secondary | ICD-10-CM | POA: Diagnosis not present

## 2016-03-13 DIAGNOSIS — Z95828 Presence of other vascular implants and grafts: Secondary | ICD-10-CM

## 2016-03-13 DIAGNOSIS — K219 Gastro-esophageal reflux disease without esophagitis: Secondary | ICD-10-CM | POA: Insufficient documentation

## 2016-03-13 DIAGNOSIS — Z803 Family history of malignant neoplasm of breast: Secondary | ICD-10-CM | POA: Insufficient documentation

## 2016-03-13 DIAGNOSIS — M549 Dorsalgia, unspecified: Secondary | ICD-10-CM | POA: Diagnosis not present

## 2016-03-13 DIAGNOSIS — Z833 Family history of diabetes mellitus: Secondary | ICD-10-CM | POA: Insufficient documentation

## 2016-03-13 DIAGNOSIS — R030 Elevated blood-pressure reading, without diagnosis of hypertension: Secondary | ICD-10-CM | POA: Diagnosis not present

## 2016-03-13 DIAGNOSIS — Z171 Estrogen receptor negative status [ER-]: Secondary | ICD-10-CM | POA: Insufficient documentation

## 2016-03-13 DIAGNOSIS — R011 Cardiac murmur, unspecified: Secondary | ICD-10-CM | POA: Insufficient documentation

## 2016-03-13 DIAGNOSIS — G8929 Other chronic pain: Secondary | ICD-10-CM | POA: Diagnosis not present

## 2016-03-13 DIAGNOSIS — C50912 Malignant neoplasm of unspecified site of left female breast: Secondary | ICD-10-CM | POA: Diagnosis present

## 2016-03-13 DIAGNOSIS — Z836 Family history of other diseases of the respiratory system: Secondary | ICD-10-CM | POA: Insufficient documentation

## 2016-03-13 DIAGNOSIS — E079 Disorder of thyroid, unspecified: Secondary | ICD-10-CM | POA: Diagnosis not present

## 2016-03-13 DIAGNOSIS — I493 Ventricular premature depolarization: Secondary | ICD-10-CM | POA: Insufficient documentation

## 2016-03-13 HISTORY — PX: PORTACATH PLACEMENT: SHX2246

## 2016-03-13 HISTORY — DX: Gastro-esophageal reflux disease without esophagitis: K21.9

## 2016-03-13 HISTORY — DX: Other seasonal allergic rhinitis: J30.2

## 2016-03-13 HISTORY — DX: Anemia, unspecified: D64.9

## 2016-03-13 HISTORY — DX: Adverse effect of unspecified anesthetic, initial encounter: T41.45XA

## 2016-03-13 HISTORY — PX: RADIOACTIVE SEED GUIDED PARTIAL MASTECTOMY WITH AXILLARY SENTINEL LYMPH NODE BIOPSY: SHX6520

## 2016-03-13 HISTORY — DX: Malignant (primary) neoplasm, unspecified: C80.1

## 2016-03-13 HISTORY — DX: Other complications of anesthesia, initial encounter: T88.59XA

## 2016-03-13 HISTORY — DX: Hypothyroidism, unspecified: E03.9

## 2016-03-13 SURGERY — RADIOACTIVE SEED GUIDED PARTIAL MASTECTOMY WITH AXILLARY SENTINEL LYMPH NODE BIOPSY
Anesthesia: General | Site: Neck | Laterality: Right

## 2016-03-13 MED ORDER — BUPIVACAINE HCL (PF) 0.25 % IJ SOLN
INTRAMUSCULAR | Status: DC | PRN
  Administered 2016-03-13: 20 mL

## 2016-03-13 MED ORDER — METHYLENE BLUE 0.5 % INJ SOLN
INTRAVENOUS | Status: AC
  Filled 2016-03-13: qty 10

## 2016-03-13 MED ORDER — PROPOFOL 10 MG/ML IV BOLUS
INTRAVENOUS | Status: DC | PRN
  Administered 2016-03-13: 150 mg via INTRAVENOUS

## 2016-03-13 MED ORDER — EPHEDRINE SULFATE 50 MG/ML IJ SOLN
INTRAMUSCULAR | Status: DC | PRN
  Administered 2016-03-13 (×9): 10 mg via INTRAVENOUS

## 2016-03-13 MED ORDER — LIDOCAINE 2% (20 MG/ML) 5 ML SYRINGE
INTRAMUSCULAR | Status: AC
  Filled 2016-03-13: qty 5

## 2016-03-13 MED ORDER — FENTANYL CITRATE (PF) 100 MCG/2ML IJ SOLN
INTRAMUSCULAR | Status: AC
  Filled 2016-03-13: qty 2

## 2016-03-13 MED ORDER — MIDAZOLAM HCL 2 MG/2ML IJ SOLN
1.0000 mg | INTRAMUSCULAR | Status: DC | PRN
  Administered 2016-03-13: 2 mg via INTRAVENOUS

## 2016-03-13 MED ORDER — TECHNETIUM TC 99M SULFUR COLLOID FILTERED
1.0000 | Freq: Once | INTRAVENOUS | Status: AC | PRN
  Administered 2016-03-13: 1 via INTRADERMAL

## 2016-03-13 MED ORDER — BUPIVACAINE-EPINEPHRINE (PF) 0.5% -1:200000 IJ SOLN
INTRAMUSCULAR | Status: DC | PRN
  Administered 2016-03-13: 30 mL via PERINEURAL

## 2016-03-13 MED ORDER — ACETAMINOPHEN 500 MG PO TABS
1000.0000 mg | ORAL_TABLET | ORAL | Status: AC
  Administered 2016-03-13: 1000 mg via ORAL

## 2016-03-13 MED ORDER — PROMETHAZINE HCL 25 MG/ML IJ SOLN: 6.2500 mg | INTRAMUSCULAR | 0 refills | Status: DC | PRN

## 2016-03-13 MED ORDER — GABAPENTIN 300 MG PO CAPS
ORAL_CAPSULE | ORAL | Status: AC
  Filled 2016-03-13: qty 1

## 2016-03-13 MED ORDER — HEPARIN (PORCINE) IN NACL 2-0.9 UNIT/ML-% IJ SOLN
INTRAMUSCULAR | Status: AC
  Filled 2016-03-13: qty 500

## 2016-03-13 MED ORDER — LACTATED RINGERS IV SOLN
INTRAVENOUS | Status: DC
  Administered 2016-03-13 (×2): via INTRAVENOUS

## 2016-03-13 MED ORDER — CHLORHEXIDINE GLUCONATE CLOTH 2 % EX PADS
6.0000 | MEDICATED_PAD | Freq: Once | CUTANEOUS | Status: DC
Start: 2016-03-13 — End: 2016-03-13

## 2016-03-13 MED ORDER — ACETAMINOPHEN 500 MG PO TABS
ORAL_TABLET | ORAL | Status: AC
  Filled 2016-03-13: qty 2

## 2016-03-13 MED ORDER — CEFAZOLIN IN D5W 1 GM/50ML IV SOLN
INTRAVENOUS | Status: AC
  Filled 2016-03-13: qty 50

## 2016-03-13 MED ORDER — SODIUM CHLORIDE 0.9 % IJ SOLN
INTRAMUSCULAR | Status: AC
  Filled 2016-03-13: qty 10

## 2016-03-13 MED ORDER — CEFAZOLIN SODIUM-DEXTROSE 2-4 GM/100ML-% IV SOLN
INTRAVENOUS | Status: AC
  Filled 2016-03-13: qty 100

## 2016-03-13 MED ORDER — PROMETHAZINE HCL 25 MG/ML IJ SOLN: 6.2500 mg | INTRAMUSCULAR | Status: DC | PRN

## 2016-03-13 MED ORDER — HEPARIN (PORCINE) IN NACL 2-0.9 UNIT/ML-% IJ SOLN
INTRAMUSCULAR | Status: DC | PRN
  Administered 2016-03-13: 500 mL via INTRAVENOUS

## 2016-03-13 MED ORDER — EPHEDRINE 5 MG/ML INJ
INTRAVENOUS | Status: AC
  Filled 2016-03-13: qty 10

## 2016-03-13 MED ORDER — PHENYLEPHRINE 40 MCG/ML (10ML) SYRINGE FOR IV PUSH (FOR BLOOD PRESSURE SUPPORT)
PREFILLED_SYRINGE | INTRAVENOUS | Status: AC
  Filled 2016-03-13: qty 10

## 2016-03-13 MED ORDER — DEXAMETHASONE SODIUM PHOSPHATE 4 MG/ML IJ SOLN
INTRAMUSCULAR | Status: DC | PRN
  Administered 2016-03-13: 10 mg via INTRAVENOUS

## 2016-03-13 MED ORDER — DEXTROSE 5 % IV SOLN
3.0000 g | INTRAVENOUS | Status: AC
  Administered 2016-03-13: 3 g via INTRAVENOUS

## 2016-03-13 MED ORDER — SUGAMMADEX SODIUM 500 MG/5ML IV SOLN
INTRAVENOUS | Status: AC
  Filled 2016-03-13: qty 5

## 2016-03-13 MED ORDER — CELECOXIB 200 MG PO CAPS
ORAL_CAPSULE | ORAL | Status: AC
  Filled 2016-03-13: qty 2

## 2016-03-13 MED ORDER — CHLORHEXIDINE GLUCONATE CLOTH 2 % EX PADS: 6.0000 | MEDICATED_PAD | Freq: Once | CUTANEOUS | Status: DC

## 2016-03-13 MED ORDER — ONDANSETRON HCL 4 MG/2ML IJ SOLN
INTRAMUSCULAR | Status: AC
  Filled 2016-03-13: qty 2

## 2016-03-13 MED ORDER — CELECOXIB 400 MG PO CAPS
400.0000 mg | ORAL_CAPSULE | ORAL | Status: AC
  Administered 2016-03-13: 400 mg via ORAL

## 2016-03-13 MED ORDER — EPHEDRINE 5 MG/ML INJ
INTRAVENOUS | Status: AC
  Filled 2016-03-13: qty 20

## 2016-03-13 MED ORDER — BUPIVACAINE HCL (PF) 0.25 % IJ SOLN
INTRAMUSCULAR | Status: AC
  Filled 2016-03-13: qty 30

## 2016-03-13 MED ORDER — MIDAZOLAM HCL 2 MG/2ML IJ SOLN
INTRAMUSCULAR | Status: AC
  Filled 2016-03-13: qty 2

## 2016-03-13 MED ORDER — METHYLENE BLUE 0.5 % INJ SOLN
INTRAVENOUS | Status: DC | PRN
  Administered 2016-03-13: 5 mL

## 2016-03-13 MED ORDER — ONDANSETRON HCL 4 MG/2ML IJ SOLN
INTRAMUSCULAR | Status: DC | PRN
  Administered 2016-03-13: 4 mg via INTRAVENOUS

## 2016-03-13 MED ORDER — FENTANYL CITRATE (PF) 100 MCG/2ML IJ SOLN
50.0000 ug | INTRAMUSCULAR | Status: DC | PRN
  Administered 2016-03-13 (×2): 50 ug via INTRAVENOUS

## 2016-03-13 MED ORDER — FENTANYL CITRATE (PF) 100 MCG/2ML IJ SOLN: 25.0000 ug | INTRAMUSCULAR | Status: DC | PRN

## 2016-03-13 MED ORDER — GABAPENTIN 300 MG PO CAPS
300.0000 mg | ORAL_CAPSULE | ORAL | Status: AC
  Administered 2016-03-13: 300 mg via ORAL

## 2016-03-13 MED ORDER — HEPARIN SOD (PORK) LOCK FLUSH 100 UNIT/ML IV SOLN
INTRAVENOUS | Status: AC
  Filled 2016-03-13: qty 5

## 2016-03-13 MED ORDER — PHENYLEPHRINE HCL 10 MG/ML IJ SOLN
INTRAMUSCULAR | Status: DC | PRN
  Administered 2016-03-13: 80 ug via INTRAVENOUS
  Administered 2016-03-13 (×3): 40 ug via INTRAVENOUS
  Administered 2016-03-13: 80 ug via INTRAVENOUS
  Administered 2016-03-13 (×4): 40 ug via INTRAVENOUS

## 2016-03-13 MED ORDER — HEPARIN SOD (PORK) LOCK FLUSH 100 UNIT/ML IV SOLN
INTRAVENOUS | Status: DC | PRN
  Administered 2016-03-13: 400 [IU] via INTRAVENOUS

## 2016-03-13 MED ORDER — LIDOCAINE 2% (20 MG/ML) 5 ML SYRINGE
INTRAMUSCULAR | Status: DC | PRN
  Administered 2016-03-13: 50 mg via INTRAVENOUS

## 2016-03-13 MED ORDER — SUGAMMADEX SODIUM 200 MG/2ML IV SOLN
INTRAVENOUS | Status: DC | PRN
  Administered 2016-03-13: 300 mg via INTRAVENOUS

## 2016-03-13 MED ORDER — FENTANYL CITRATE (PF) 100 MCG/2ML IJ SOLN
INTRAMUSCULAR | Status: DC | PRN
  Administered 2016-03-13: 25 ug via INTRAVENOUS
  Administered 2016-03-13: 50 ug via INTRAVENOUS
  Administered 2016-03-13: 25 ug via INTRAVENOUS

## 2016-03-13 MED ORDER — SCOPOLAMINE 1 MG/3DAYS TD PT72: 1.0000 | MEDICATED_PATCH | Freq: Once | TRANSDERMAL | Status: DC | PRN

## 2016-03-13 MED ORDER — ROCURONIUM BROMIDE 100 MG/10ML IV SOLN
INTRAVENOUS | Status: DC | PRN
  Administered 2016-03-13: 10 mg via INTRAVENOUS
  Administered 2016-03-13: 40 mg via INTRAVENOUS

## 2016-03-13 MED ORDER — DEXAMETHASONE SODIUM PHOSPHATE 10 MG/ML IJ SOLN
INTRAMUSCULAR | Status: AC
  Filled 2016-03-13: qty 1

## 2016-03-13 MED ORDER — OXYCODONE HCL 5 MG PO TABS: 5.0000 mg | ORAL_TABLET | Freq: Four times a day (QID) | ORAL | 0 refills | Status: DC | PRN

## 2016-03-13 SURGICAL SUPPLY — 72 items
APPLIER CLIP 9.375 MED OPEN (MISCELLANEOUS) ×4
BAG DECANTER FOR FLEXI CONT (MISCELLANEOUS) ×4 IMPLANT
BENZOIN TINCTURE PRP APPL 2/3 (GAUZE/BANDAGES/DRESSINGS) IMPLANT
BINDER BREAST 3XL (BIND) ×4 IMPLANT
BINDER BREAST LRG (GAUZE/BANDAGES/DRESSINGS) IMPLANT
BINDER BREAST MEDIUM (GAUZE/BANDAGES/DRESSINGS) IMPLANT
BINDER BREAST XLRG (GAUZE/BANDAGES/DRESSINGS) IMPLANT
BINDER BREAST XXLRG (GAUZE/BANDAGES/DRESSINGS) IMPLANT
BLADE HEX COATED 2.75 (ELECTRODE) ×4 IMPLANT
BLADE SURG 11 STRL SS (BLADE) ×4 IMPLANT
BLADE SURG 15 STRL LF DISP TIS (BLADE) ×2 IMPLANT
BLADE SURG 15 STRL SS (BLADE) ×2
CANISTER SUC SOCK COL 7IN (MISCELLANEOUS) IMPLANT
CANISTER SUCT 1200ML W/VALVE (MISCELLANEOUS) ×4 IMPLANT
CHLORAPREP W/TINT 26ML (MISCELLANEOUS) ×4 IMPLANT
CLIP APPLIE 9.375 MED OPEN (MISCELLANEOUS) ×2 IMPLANT
CLOSURE WOUND 1/2 X4 (GAUZE/BANDAGES/DRESSINGS)
COVER BACK TABLE 60X90IN (DRAPES) ×4 IMPLANT
COVER MAYO STAND STRL (DRAPES) ×4 IMPLANT
COVER PROBE 5X48 (MISCELLANEOUS) ×2
COVER PROBE W GEL 5X96 (DRAPES) ×4 IMPLANT
DECANTER SPIKE VIAL GLASS SM (MISCELLANEOUS) IMPLANT
DERMABOND ADVANCED (GAUZE/BANDAGES/DRESSINGS) ×4
DERMABOND ADVANCED .7 DNX12 (GAUZE/BANDAGES/DRESSINGS) ×4 IMPLANT
DEVICE DUBIN W/COMP PLATE 8390 (MISCELLANEOUS) ×4 IMPLANT
DRAPE C-ARM 42X72 X-RAY (DRAPES) ×4 IMPLANT
DRAPE LAPAROSCOPIC ABDOMINAL (DRAPES) ×4 IMPLANT
DRAPE UTILITY XL STRL (DRAPES) ×8 IMPLANT
DRSG TEGADERM 2-3/8X2-3/4 SM (GAUZE/BANDAGES/DRESSINGS) IMPLANT
ELECT COATED BLADE 2.86 ST (ELECTRODE) ×4 IMPLANT
ELECT REM PT RETURN 9FT ADLT (ELECTROSURGICAL) ×4
ELECTRODE REM PT RTRN 9FT ADLT (ELECTROSURGICAL) ×2 IMPLANT
GEL ULTRASOUND 8.5O AQUASONIC (MISCELLANEOUS) ×4 IMPLANT
GLOVE BIO SURGEON STRL SZ7 (GLOVE) ×4 IMPLANT
GLOVE BIOGEL PI IND STRL 8 (GLOVE) ×2 IMPLANT
GLOVE BIOGEL PI INDICATOR 8 (GLOVE) ×2
GLOVE ECLIPSE 8.0 STRL XLNG CF (GLOVE) ×4 IMPLANT
GOWN STRL REUS W/ TWL LRG LVL3 (GOWN DISPOSABLE) ×6 IMPLANT
GOWN STRL REUS W/TWL LRG LVL3 (GOWN DISPOSABLE) ×6
HEMOSTAT SNOW SURGICEL 2X4 (HEMOSTASIS) ×4 IMPLANT
IV KIT MINILOC 20X1 SAFETY (NEEDLE) IMPLANT
KIT CVR 48X5XPRB PLUP LF (MISCELLANEOUS) ×2 IMPLANT
KIT MARKER MARGIN INK (KITS) ×4 IMPLANT
KIT PORT POWER 8FR ISP CVUE (Catheter) ×4 IMPLANT
NDL SAFETY ECLIPSE 18X1.5 (NEEDLE) IMPLANT
NEEDLE HYPO 18GX1.5 SHARP (NEEDLE)
NEEDLE HYPO 22GX1.5 SAFETY (NEEDLE) IMPLANT
NEEDLE HYPO 25X1 1.5 SAFETY (NEEDLE) ×8 IMPLANT
NEEDLE SPNL 22GX3.5 QUINCKE BK (NEEDLE) IMPLANT
NS IRRIG 1000ML POUR BTL (IV SOLUTION) ×4 IMPLANT
PACK BASIN DAY SURGERY FS (CUSTOM PROCEDURE TRAY) ×4 IMPLANT
PENCIL BUTTON HOLSTER BLD 10FT (ELECTRODE) ×4 IMPLANT
SET SHEATH INTRODUCER 10FR (MISCELLANEOUS) IMPLANT
SHEATH COOK PEEL AWAY SET 9F (SHEATH) IMPLANT
SLEEVE SCD COMPRESS KNEE MED (MISCELLANEOUS) ×4 IMPLANT
SPONGE GAUZE 4X4 12PLY STER LF (GAUZE/BANDAGES/DRESSINGS) IMPLANT
SPONGE LAP 4X18 X RAY DECT (DISPOSABLE) ×4 IMPLANT
STRIP CLOSURE SKIN 1/2X4 (GAUZE/BANDAGES/DRESSINGS) IMPLANT
SUT MNCRL AB 4-0 PS2 18 (SUTURE) ×4 IMPLANT
SUT MON AB 4-0 PC3 18 (SUTURE) ×4 IMPLANT
SUT PROLENE 2 0 CT2 30 (SUTURE) IMPLANT
SUT PROLENE 2 0 SH DA (SUTURE) ×4 IMPLANT
SUT SILK 2 0 TIES 17X18 (SUTURE)
SUT SILK 2-0 18XBRD TIE BLK (SUTURE) IMPLANT
SUT VICRYL 3-0 CR8 SH (SUTURE) ×4 IMPLANT
SYR 5ML LUER SLIP (SYRINGE) ×4 IMPLANT
SYR CONTROL 10ML LL (SYRINGE) ×8 IMPLANT
TOWEL OR 17X24 6PK STRL BLUE (TOWEL DISPOSABLE) ×8 IMPLANT
TOWEL OR NON WOVEN STRL DISP B (DISPOSABLE) IMPLANT
TUBE CONNECTING 20'X1/4 (TUBING) ×1
TUBE CONNECTING 20X1/4 (TUBING) ×3 IMPLANT
YANKAUER SUCT BULB TIP NO VENT (SUCTIONS) ×4 IMPLANT

## 2016-03-13 NOTE — Anesthesia Preprocedure Evaluation (Addendum)
Anesthesia Evaluation  Patient identified by MRN, date of birth, ID band Patient awake    Reviewed: Allergy & Precautions, NPO status , Patient's Chart, lab work & pertinent test results  History of Anesthesia Complications (+) history of anesthetic complications ("trouble waking up")  Airway Mallampati: II  TM Distance: >3 FB Neck ROM: Limited Positive for:  Tracheal deviation   Dental  (+) Teeth Intact, Dental Advisory Given, Caps,    Pulmonary neg pulmonary ROS,    Pulmonary exam normal breath sounds clear to auscultation       Cardiovascular hypertension, Pt. on medications Normal cardiovascular exam Rhythm:Regular Rate:Normal     Neuro/Psych negative neurological ROS  negative psych ROS   GI/Hepatic Neg liver ROS, GERD  Medicated,  Endo/Other  Hypothyroidism Morbid obesity  Renal/GU negative Renal ROS     Musculoskeletal negative musculoskeletal ROS (+)   Abdominal   Peds  Hematology   Anesthesia Other Findings Day of surgery medications reviewed with the patient.  History of head/neck cancer s/p chemoradiation  Reproductive/Obstetrics                           Anesthesia Physical Anesthesia Plan  ASA: II  Anesthesia Plan: General   Post-op Pain Management:  Regional for Post-op pain   Induction: Intravenous  Airway Management Planned: Oral ETT and Video Laryngoscope Planned  Additional Equipment:   Intra-op Plan:   Post-operative Plan: Extubation in OR  Informed Consent: I have reviewed the patients History and Physical, chart, labs and discussed the procedure including the risks, benefits and alternatives for the proposed anesthesia with the patient or authorized representative who has indicated his/her understanding and acceptance.   Dental advisory given  Plan Discussed with: CRNA  Anesthesia Plan Comments: (Risks/benefits of general anesthesia discussed with  patient including risk of damage to teeth, lips, gum, and tongue, nausea/vomiting, allergic reactions to medications, and the possibility of heart attack, stroke and death.  All patient questions answered.  Patient wishes to proceed.  PEC block.  Glidescope due to limited neck movement, tracheal deviation s/p neck radiation.)      Anesthesia Quick Evaluation

## 2016-03-13 NOTE — Transfer of Care (Signed)
Immediate Anesthesia Transfer of Care Note  Patient: Carla Pierce  Procedure(s) Performed: Procedure(s): LEFT BREAST RADIOACTIVE SEED GUIDED PARTIAL MASTECTOMY WITH AXILLARY SENTINEL LYMPH NODE BIOPSY (Left) INSERTION PORT-A-CATH (Right)  Patient Location: PACU  Anesthesia Type:General  Level of Consciousness: awake, alert  and oriented  Airway & Oxygen Therapy: Patient Spontanous Breathing and Patient connected to face mask oxygen  Post-op Assessment: Report given to RN and Post -op Vital signs reviewed and stable  Post vital signs: Reviewed and stable  Last Vitals:  Vitals:   03/13/16 1246 03/13/16 1248  BP: 121/66   Pulse: 93 92  Resp:  (!) 21  Temp:      Last Pain:  Vitals:   03/13/16 0820  TempSrc: Oral      Patients Stated Pain Goal: 0 (33/83/29 1916)  Complications: No apparent anesthesia complications

## 2016-03-13 NOTE — H&P (View-Only) (Signed)
Ercell Perlman 02/15/2016 7:36 AM Location: [facility] Patient #: [id number] DOB: Sep 22, 1970 Undefined / Language: [name] / Race: Black or African American Female  History of Present Illness [name] A. Wess Baney MD; 02/15/2016 2:19 PM) Patient words: patient sent at the request of Dr. [name] for 1.8 cm left breast mass in the upper outer quadrant. This was picked up on screening mammogram. 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 Left axilla is negative. She gives no family history of breast cancer. 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 Patient denies aast mass.y of breast pain, nipple discharge or breast mass.  The patient is a 46 year old female.   Past Surgical History [name], RN; 02/15/2016 7:36 AM) Breast Biopsy Left.  Diagnostic Studies History [name], RN; 02/15/2016 7:36 AM) Colonoscopy never Mammogram within last year Pap Smear 1-5 years ago  Medication History [name], RN; 02/15/2016 7:36 AM) Medications Reconciled  Social History [name], RN; 02/15/2016 7:36 AM) Alcohol use Occasional alcohol use. Caffeine use Carbonated beverages, Coffee, Tea. No drug use Tobacco use Never smoker.  Family History [name], RN; 02/15/2016 7:36 AM) Breast Cancer Family Members In General, Mother. Cancer Family Members In General. Diabetes Mellitus Family Members In General. Respiratory Condition Family Members In General.  Pregnancy / Birth History [name], RN; 02/15/2016 7:36 AM) Age at menarche 31 years. Contraceptive History Depo-provera, Oral contraceptives. Gravida 1 Irregular periods Maternal age 25-30 Para 1  Other Problems [name], RN; 02/15/2016 7:36 AM) Back Pain Cancer Gastroesophageal Reflux Disease Heart murmur High blood  pressure Thyroid Disease     Review of Systems [name] RN; 02/15/2016 7:36 AM) General Present- Weight Gain and Weight Loss. Not Present- Appetite Loss, Chills, Fatigue, Fever and Night Sweats. Skin Not Present- Change in Wart/Mole, Dryness, Hives, Jaundice, New Lesions, Non-Healing Wounds, Rash and Ulcer. HEENT Present- Seasonal Allergies and Wears glasses/contact lenses. Not Present- Earache, Hearing Loss, Hoarseness, Nose Bleed, Oral Ulcers, Ringing in the Ears, Sinus Pain, Sore Throat, Visual Disturbances and Yellow Eyes. Respiratory Present- Snoring. Not Present- Bloody sputum, Chronic Cough, Difficulty Breathing and Wheezing. Breast Present- Breast Mass. Not Present- Breast Pain, Nipple Discharge and Skin Changes. Cardiovascular Present- Leg Cramps. Not Present- Chest Pain, Difficulty Breathing Lying Down, Palpitations, Rapid Heart Rate, Shortness of Breath and Swelling of Extremities. Gastrointestinal Present- Difficulty Swallowing and Excessive gas. Not Present- Abdominal Pain, Bloating, Bloody Stool, Change in Bowel Habits, Chronic diarrhea, Constipation, Gets full quickly at meals, Hemorrhoids, Indigestion, Nausea, Rectal Pain and Vomiting. Female Genitourinary Not Present- Frequency, Nocturia, Painful Urination, Pelvic Pain and Urgency. Musculoskeletal Present- Back Pain. Not Present- Joint Pain, Joint Stiffness, Muscle Pain, Muscle Weakness and Swelling of Extremities. Neurological Not Present- Decreased Memory, Fainting, Headaches, Numbness, Seizures, Tingling, Tremor, Trouble walking and Weakness. Psychiatric Present- Change in Sleep Pattern. Not Present- Anxiety, Bipolar, Depression, Fearful and Frequent crying. Endocrine Present- Cold Intolerance. Not Present- Excessive Hunger, Hair Changes, Heat Intolerance, Hot flashes and New Diabetes. Hematology Not Present- Blood Thinners, Easy Bruising, Excessive bleeding, Gland problems, HIV and Persistent Infections.   Physical  Exam (Mikale Silversmith A. Shambhavi Salley MD; 02/15/2016 2:20 PM)  General Mental Status-Alert. General Appearance-Consistent with stated age. Hydration-Well hydrated. Voice-Normal.  Head and Neck Note: large left neck scar with contracture.  Breast Breast - Left-Symmetric, Non Tender, No Biopsy scars, no Dimpling, No Inflammation, No Lumpectomy scars, No Mastectomy  scars, No Peau d' Orange. Breast - Right-Symmetric, Non Tender, No Biopsy scars, no Dimpling, No Inflammation, No Lumpectomy scars, No Mastectomy scars, No Peau d' Orange. Breast Lump-No Palpable Breast Mass.  Cardiovascular Cardiovascular examination reveals -normal heart sounds, regular rate and rhythm with no murmurs and normal pedal pulses bilaterally.  Neurologic Neurologic evaluation reveals -alert and oriented x 3 with no impairment of recent or remote memory. Mental Status-Normal.  Musculoskeletal Normal Exam - Left-Upper Extremity Strength Normal and Lower Extremity Strength Normal. Normal Exam - Right-Upper Extremity Strength Normal and Lower Extremity Strength Normal.  Lymphatic Head & Neck  General Head & Neck Lymphatics: Bilateral - Description - Normal. Axillary  General Axillary Region: Bilateral - Description - Normal. Tenderness - Non Tender.    Assessment & Plan (Johnathen Testa A. Herbert Aguinaldo MD; 02/15/2016 2:23 PM)  BREAST CANCER, LEFT (C50.912) Impression: discussed breasts conservation versus mastectomy with reconstruction.patient is opted for left breast seed localized partial mastectomy with left axillary sentinel lymph node mapping. Risk of lumpectomy include bleeding, infection, seroma, more surgery, use of seed/wire, wound care, cosmetic deformity and the need for other treatments, death , blood clots, death. Pt agrees to proceed. Risk of sentinel lymph node mapping include bleeding, infection, lymphedema, shoulder pain. stiffness, dye allergy. cosmetic deformity , blood clots, death, need for  more surgery. Pt agres to proceed.  patient will require postoperative chemotherapy. I discussed port placement on the right side. I discussed the use of ultrasound, C-arm and internal jugular vein on the right. Pt requires port placement for chemotherapy. Risk include bleeding, infection, pneumothorax, hemothorax, mediastinal injury, nerve injury , blood vessel injury, strke, blood clots, death, migration. embolization and need for additional procedures. Pt agrees to proceed.  Current Plans You are being scheduled for surgery- Our schedulers will call you.  You should hear from our office's scheduling department within 5 working days about the location, date, and time of surgery. We try to make accommodations for patient's preferences in scheduling surgery, but sometimes the OR schedule or the surgeon's schedule prevents [place] from making those accommodations.  If you have not heard from our office [phone]) in 5 working days, call the office and ask for your surgeon's nurse.  If you have other questions about your diagnosis, plan, or surgery, call the office and ask for your surgeon's nurse.  Pt Education - [facility] Breast Cancer Information Given - [facility] "Breast Journey" Package Pt Education - Pamphlet Given - Breast Biopsy: discussed with patient and provided information. We discussed the staging and pathophysiology of breast cancer. We discussed all of the different options for treatment for breast cancer including surgery, chemotherapy, radiation therapy, Herceptin, and antiestrogen therapy. We discussed a sentinel lymph node biopsy as she does not appear to having lymph node involvement right now. We discussed the performance of that with injection of radioactive tracer and blue dye. We discussed that she would have an incision underneath her axillary hairline. We discussed that there is a bout a 10-20% chance of having a positive node with a sentinel lymph node biopsy and we will await the  permanent pathology to make any other first further decisions in terms of her treatment. One of these options might be to return to the operating room to perform an axillary lymph node dissection. We discussed about a 1-2% risk lifetime of chronic shoulder pain as well as lymphedema associated with a sentinel lymph node biopsy. We discussed the options for treatment of the breast cancer which included lumpectomy versus a mastectomy.  We discussed the performance of the lumpectomy with a wire placement. We discussed a 10-20% chance of a positive margin requiring reexcision in the operating room. We also discussed that she may need radiation therapy or antiestrogen therapy or both if she undergoes lumpectomy. We discussed the mastectomy and the postoperative care for that as well. We discussed that there is no difference in her survival whether she undergoes lumpectomy with radiation therapy or antiestrogen therapy versus a mastectomy. There is a slight difference in the local recurrence rate being 3-5% with lumpectomy and about 1% with a mastectomy. We discussed the risks of operation including bleeding, infection, possible reoperation. She understands her further therapy will be based on what her stages at the time of her operation.  Pt Education - flb breast cancer surgery: discussed with patient and provided information. Pt Education - [facility] Breast Biopsy HCI: discussed with patient and provided information. Pt Education - ABC (After Breast Cancer) Class Info: discussed with patient and provided information. Use of a central venous catheter for intravenous therapy was discussed. Technique of catheter placement using ultrasound and fluoroscopy guidance was discussed. Risks such as bleeding, infection, pneumothorax, catheter occlusion, reoperation, and other risks were discussed. I noted a good likelihood this will help address the problem. Questions were answered. The patient expressed understanding &  wishes to proceed.

## 2016-03-13 NOTE — Anesthesia Postprocedure Evaluation (Signed)
Anesthesia Post Note  Patient: Alex Gardener  Procedure(s) Performed: Procedure(s) (LRB): LEFT BREAST RADIOACTIVE SEED GUIDED PARTIAL MASTECTOMY WITH AXILLARY SENTINEL LYMPH NODE BIOPSY (Left) INSERTION PORT-A-CATH (Right)  Patient location during evaluation: PACU Anesthesia Type: General Level of consciousness: awake and alert Pain management: pain level controlled Vital Signs Assessment: post-procedure vital signs reviewed and stable Respiratory status: spontaneous breathing, nonlabored ventilation, respiratory function stable and patient connected to nasal cannula oxygen Cardiovascular status: blood pressure returned to baseline and stable Postop Assessment: no signs of nausea or vomiting Anesthetic complications: no       Last Vitals:  Vitals:   03/13/16 1315 03/13/16 1330  BP: 133/76 (!) 142/76  Pulse: 92 96  Resp: (!) 21 15  Temp:      Last Pain:  Vitals:   03/13/16 1248  TempSrc:   PainSc: 0-No pain                 Catalina Gravel

## 2016-03-13 NOTE — Op Note (Signed)
Preoperative diagnosis: Stage I left breast cancer  Postoperative diagnosis: Same  Procedure: Left breast seed localized partial mastectomy with left axillary deep sentinel lymph node mapping with methylene blue dye and placement of right internal jugular 8 [place] Port-A-Cath with fluoroscopic and ultrasound guidance  Surgeon: [name] M.D.  Anesthesia: Gen. pectoral block and local  EBL: 20 mL  Drains: None  Specimen: Left breast mass with localizing seed and clip to pathology with grossly negative margins and 2 left axillary sentinel lymph nodes blue and hot  Indications for procedure: The patient's 46 year old female diagnosed with left breast cancer secondary to a mass and core biopsy of this mass in the left breast that showed invasive ductal carcinoma. She opted for breast conservation but will require postoperative chemotherapy and needs a Port-A-Cath as well. The multidisciplinary breast clinic by medical oncology, radiation oncology and surgery.The procedure has been discussed with the patient. Alternatives to surgery have been discussed with the patient.  Risks of surgery include bleeding,  Infection,  Seroma formation, death,  and the need for further surgery.   The patient understands and wishes to proceed.Sentinel lymph node mapping and dissection has been discussed with the patient.  Risk of bleeding,  Infection,  Seroma formation,  Additional procedures,,  Shoulder weakness ,  Shoulder stiffness,  Nerve and blood vessel injury and reaction to the mapping dyes have been discussed.  Alternatives to surgery have been discussed with the patient.  The patient agrees to proceed.The procedure has been discussed with the patient. The risks,  benefits and alternatives to surgery have been discussed. The patient understands the reason for the procedure and agrees to proceed. Risk of bleeding, infection, collapsed lung, death, DVT, major blood vessel injury requiring repair, major nerve  injury, mediastinal injury, catheter migration, catheter repositioning, perforation of internal viscera, blood clots, and the need for other access and/or procedures All questions are answered today to the best of my ability.    Description of procedure: The patient was met in the holding area. Left breast was marked with a correct side. She underwent pectoral block by anesthesia and injection of technetium soft collar by radiology. Her clip was placed as an outpatient. Her films were reviewed. Questions were answered and she agreed to proceed after explanation of surgery with the risks and benefits. She was taken back to the operating room and placed upon the OR table. After induction of general anesthesia, her neck and upper chest regions were prepped and draped in a sterile fashion bilaterally. The Port-A-Cath was done first. After timeout was done I used ultrasound to visualize the right internal jugular vein. She was placed in Trendelenburg. A needle was introduced into the right internal jugular vein with ultrasound guidance. A wire was fed through this and it went down into her superior vena cava into her right atrium and ventricle. The wire was pulled back. Small skin incision was made at the wire insertion site. Incision was made below the right clavicle and a pocket was fashioned for the port. The port was brought onto the field was attached and flushed. I then tunneled the port from the lower incision to the upper incision. With the patient in Trendelenburg I was unable to introduce the dilator introducer complex over the wire advancing his carefully moving the wire 2 and fro without resistance. Once this was in place the wire and introducer removed intact. I then placed a catheter into the introducer and the peel away sheath was peeled away. The initial  film showed the catheter tip to be into the right ventricle. I pulled the port back and cut the catheter back by 2 cm and then reattached it back to  the top which was secure. The repeat fluoroscopic images showed the tip to be in the mid superior vena cava. Of note, she had significant ectopy and I elected to keep the catheter higher in the cavoatrial junction since I had concerns about postoperative ectopy with the catheter being lower. The catheter drew back dark nonpulsatile blood. It flushed easily in was flushed with heparinized saline 5 mL of concentrate. The catheter secured to the chest wall with 2-0 Prolene. Both incisions were closed with 3-0 Vicryl and 4-0 Monocryl.  Left breast lumpectomy done next. Neoprobe used to identify the mass in the left upper outer quadrant. Incision made in the left axilla. Dissection was carried around the tissue of the seeding clipped using the neoprobe. The entire mass was excised with a grossly negative margin. Balloon was irrigated and found to be hemostatic. 5 mL of methylene blue admixed with saline were injected under left nipple. This was massaged. Neoprobe was used in the deep left axillary level I region I discovered 2 blue and hot nodes and these were removed. Background counts approached 0. Irrigation was used and hemostasis achieved with cautery and Surgicel snow. This wound was closed with 3-0 Vicryl and 4-0 Monocryl. Liquid adhesive applied to all incisions. Breast binder placed. All final counts sponge, needles and instruments are found to be correct. Patient was awoke extubated taken recovery in satisfactory condition. Chest x-ray will be obtained. The [facility] narcotic database registry was queried and no comforting information found.

## 2016-03-13 NOTE — Interval H&P Note (Signed)
History and Physical Interval Note:  03/13/2016 10:00 AM  Carla Pierce  has presented today for surgery, with the diagnosis of BREAST CANCER  The various methods of treatment have been discussed with the patient and family. After consideration of risks, benefits and other options for treatment, the patient has consented to  Procedure(s): RADIOACTIVE SEED GUIDED PARTIAL MASTECTOMY WITH AXILLARY SENTINEL LYMPH NODE BIOPSY (Left) INSERTION PORT-A-CATH (N/A) as a surgical intervention .  The patient's history has been reviewed, patient examined, no change in status, stable for surgery.  I have reviewed the patient's chart and labs.  Questions were answered to the patient's satisfaction.     Denson Niccoli A.

## 2016-03-13 NOTE — Progress Notes (Signed)
Assisted Dr. Turk with left, ultrasound guided, pectoralis block. Side rails up, monitors on throughout procedure. See vital signs in flow sheet. Tolerated Procedure well. 

## 2016-03-13 NOTE — Anesthesia Procedure Notes (Signed)
Anesthesia Regional Block: Pectoralis block   Pre-Anesthetic Checklist: ,, timeout performed, Correct Patient, Correct Site, Correct Laterality, Correct Procedure, Correct Position, site marked, Risks and benefits discussed,  Surgical consent,  Pre-op evaluation,  At surgeon's request and post-op pain management  Laterality: Left  Prep: chloraprep       Needles:  Injection technique: Single-shot  Needle Type: Echogenic Needle     Needle Length: 9cm  Needle Gauge: 21     Additional Needles:   Procedures: ultrasound guided,,,,,,,,  Narrative:  Start time: 03/13/2016 9:42 AM End time: 03/13/2016 9:47 AM Injection made incrementally with aspirations every 5 mL.  Performed by: Personally  Anesthesiologist: Catalina Gravel  Additional Notes: No pain on injection. No increased resistance to injection. Injection made in 5cc increments.  Good needle visualization.  Patient tolerated procedure well.

## 2016-03-13 NOTE — Progress Notes (Signed)
Emotional support during breast injections °

## 2016-03-13 NOTE — Anesthesia Procedure Notes (Signed)
Procedure Name: Intubation Date/Time: 03/13/2016 10:40 AM Performed by: Rayvon Char Pre-anesthesia Checklist: Patient identified, Suction available, Patient being monitored and Emergency Drugs available Patient Re-evaluated:Patient Re-evaluated prior to inductionOxygen Delivery Method: Circle system utilized Preoxygenation: Pre-oxygenation with 100% oxygen Intubation Type: IV induction Ventilation: Mask ventilation without difficulty Grade View: Grade III Tube size: 7.0 mm Number of attempts: 1 Airway Equipment and Method: Video-laryngoscopy Placement Confirmation: ETT inserted through vocal cords under direct vision,  positive ETCO2 and breath sounds checked- equal and bilateral Secured at: 23 cm Tube secured with: Tape Dental Injury: Teeth and Oropharynx as per pre-operative assessment

## 2016-03-13 NOTE — Discharge Instructions (Signed)
[place] Office Phone Number [phone]     PORT-A-CATH: POST OP INSTRUCTIONS  Always review your discharge instruction sheet given to you by the facility where your surgery was performed.   1. A prescription for pain medication may be given to you upon discharge. Take your pain medication as prescribed, if needed. If narcotic pain medicine is not needed, then you make take acetaminophen (Tylenol) or ibuprofen (Advil) as needed.  2. Take your usually prescribed medications unless otherwise directed. 3. If you need a refill on your pain medication, please contact our office. All narcotic pain medicine now requires a paper prescription.  Phoned in and fax refills are no longer allowed by law.  Prescriptions will not be filled after 5 pm or on weekends.  4. You should follow a light diet for the remainder of the day after your procedure. 5. Most patients will experience some mild swelling and/or bruising in the area of the incision. It may take several days to resolve. 6. It is common to experience some constipation if taking pain medication after surgery. Increasing fluid intake and taking a stool softener (such as Colace) will usually help or prevent this problem from occurring. A mild laxative (Milk of Magnesia or Miralax) should be taken according to package directions if there are no bowel movements after 48 hours.  7. Unless discharge instructions indicate otherwise, you may remove your bandages 48 hours after surgery, and you may shower at that time. You may have steri-strips (small white skin tapes) in place directly over the incision.  These strips should be left on the skin for 7-10 days.  If your surgeon used Dermabond (skin glue) on the incision, you may shower in 24 hours.  The glue will flake off over the next 2-3 weeks.  8. If your port is left accessed at the end of surgery (needle left in port), the dressing cannot get wet and should only by changed by a healthcare  professional. When the port is no longer accessed (when the needle has been removed), follow step 7.   9. ACTIVITIES:  Limit activity involving your arms for the next 72 hours. Do no strenuous exercise or activity for 1 week. You may drive when you are no longer taking prescription pain medication, you can comfortably wear a seatbelt, and you can maneuver your car. 10.You may need to see your doctor in the office for a follow-up appointment.  Please       check with your doctor.  11.When you receive a new Port-a-Cath, you will get a product guide and        ID card.  Please keep them in case you need them.  WHEN TO CALL YOUR DOCTOR [phone]): 1. Fever over 101.0 2. Chills 3. Continued bleeding from incision 4. Increased redness and tenderness at the site 5. Shortness of breath, difficulty breathing   The clinic staff is available to answer your questions during regular business hours. Please dont hesitate to call and ask to speak to one of the nurses or medical assistants for clinical concerns. If you have a medical emergency, go to the nearest emergency room or call 911.  A surgeon from [facility] is always on call at the hospital.     For further information, please visit [email or web address]     BREAST BIOPSY/ PARTIAL MASTECTOMY: POST OP INSTRUCTIONS  Always review your discharge instruction sheet given to you by the facility where your surgery was performed.  IF YOU HAVE DISABILITY  OR FAMILY LEAVE FORMS, YOU MUST BRING THEM TO THE OFFICE FOR PROCESSING.  DO NOT GIVE THEM TO YOUR DOCTOR.  1. A prescription for pain medication may be given to you upon discharge.  Take your pain medication as prescribed, if needed.  If narcotic pain medicine is not needed, then you may take acetaminophen (Tylenol) or ibuprofen (Advil) as needed. 2. Take your usually prescribed medications unless otherwise directed 3. If you need a refill on your pain medication, please  contact your pharmacy.  They will contact our office to request authorization.  Prescriptions will not be filled after 5pm or on week-ends. 4. You should eat very light the first 24 hours after surgery, such as soup, crackers, pudding, etc.  Resume your normal diet the day after surgery. 5. Most patients will experience some swelling and bruising in the breast.  Ice packs and a good support bra will help.  Swelling and bruising can take several days to resolve.  6. It is common to experience some constipation if taking pain medication after surgery.  Increasing fluid intake and taking a stool softener will usually help or prevent this problem from occurring.  A mild laxative (Milk of Magnesia or Miralax) should be taken according to package directions if there are no bowel movements after 48 hours. 7. Unless discharge instructions indicate otherwise, you may remove your bandages 24-48 hours after surgery, and you may shower at that time.  You may have steri-strips (small skin tapes) in place directly over the incision.  These strips should be left on the skin for 7-10 days.  If your surgeon used skin glue on the incision, you may shower in 24 hours.  The glue will flake off over the next 2-3 weeks.  Any sutures or staples will be removed at the office during your follow-up visit. 8. ACTIVITIES:  You may resume regular daily activities (gradually increasing) beginning the next day.  Wearing a good support bra or sports bra minimizes pain and swelling.  You may have sexual intercourse when it is comfortable. a. You may drive when you no longer are taking prescription pain medication, you can comfortably wear a seatbelt, and you can safely maneuver your car and apply brakes. b. RETURN TO WORK:  ______________________________________________________________________________________ 9. You should see your doctor in the office for a follow-up appointment approximately two weeks after your surgery.  Your doctors  nurse will typically make your follow-up appointment when she calls you with your pathology report.  Expect your pathology report 2-3 business days after your surgery.  You may call to check if you do not hear from [place] after three days. 10. OTHER INSTRUCTIONS: _______________________________________________________________________________________________ _____________________________________________________________________________________________________________________________________ _____________________________________________________________________________________________________________________________________ _____________________________________________________________________________________________________________________________________  WHEN TO CALL YOUR DOCTOR: 1. Fever over 101.0 2. Nausea and/or vomiting. 3. Extreme swelling or bruising. 4. Continued bleeding from incision. 5. Increased pain, redness, or drainage from the incision.  The clinic staff is available to answer your questions during regular business hours.  Please dont hesitate to call and ask to speak to one of the nurses for clinical concerns.  If you have a medical emergency, go to the nearest emergency room or call 911.  A surgeon from [facility] is always on call at the hospital.  For further questions, please visit [email or web address]     Post Anesthesia Home Care Instructions  Activity: Get plenty of rest for the remainder of the day. A responsible adult should stay with you for 24 hours following the procedure.  For the next 24  hours, DO NOT: -Drive a car -Paediatric nurse -Drink alcoholic beverages -Take any medication unless instructed by your physician -Make any legal decisions or sign important papers.  Meals: Start with liquid foods such as gelatin or soup. Progress to regular foods as tolerated. Avoid greasy, spicy, heavy foods. If nausea and/or vomiting occur, drink only  clear liquids until the nausea and/or vomiting subsides. Call your physician if vomiting continues.  Special Instructions/Symptoms: Your throat may feel dry or sore from the anesthesia or the breathing tube placed in your throat during surgery. If this causes discomfort, gargle with warm salt water. The discomfort should disappear within 24 hours.  If you had a scopolamine patch placed behind your ear for the management of post- operative nausea and/or vomiting:  1. The medication in the patch is effective for 72 hours, after which it should be removed.  Wrap patch in a tissue and discard in the trash. Wash hands thoroughly with soap and water. 2. You may remove the patch earlier than 72 hours if you experience unpleasant side effects which may include dry mouth, dizziness or visual disturbances. 3. Avoid touching the patch. Wash your hands with soap and water after contact with the patch.

## 2016-03-14 ENCOUNTER — Encounter (HOSPITAL_BASED_OUTPATIENT_CLINIC_OR_DEPARTMENT_OTHER): Payer: Self-pay | Admitting: Surgery

## 2016-03-14 NOTE — Addendum Note (Signed)
Addendum  created 03/14/16 1008 by Tawni Millers, CRNA   Charge Capture section accepted

## 2016-03-19 ENCOUNTER — Ambulatory Visit (HOSPITAL_BASED_OUTPATIENT_CLINIC_OR_DEPARTMENT_OTHER): Payer: BLUE CROSS/BLUE SHIELD | Admitting: Hematology and Oncology

## 2016-03-19 VITALS — BP 138/77 | HR 86 | Temp 97.9°F | Resp 20 | Wt 294.5 lb

## 2016-03-19 DIAGNOSIS — C50412 Malignant neoplasm of upper-outer quadrant of left female breast: Secondary | ICD-10-CM

## 2016-03-19 DIAGNOSIS — Z171 Estrogen receptor negative status [ER-]: Secondary | ICD-10-CM

## 2016-03-19 MED ORDER — LIDOCAINE-PRILOCAINE 2.5-2.5 % EX CREA: TOPICAL_CREAM | CUTANEOUS | 3 refills | Status: DC

## 2016-03-19 MED ORDER — LORAZEPAM 0.5 MG PO TABS: 0.5000 mg | ORAL_TABLET | Freq: Every day | ORAL | 0 refills | Status: DC

## 2016-03-19 MED ORDER — PROCHLORPERAZINE MALEATE 10 MG PO TABS: 10.0000 mg | ORAL_TABLET | Freq: Four times a day (QID) | ORAL | 1 refills | Status: DC | PRN

## 2016-03-19 MED ORDER — DEXAMETHASONE 4 MG PO TABS: 4.0000 mg | ORAL_TABLET | Freq: Every day | ORAL | 1 refills | Status: AC

## 2016-03-19 MED ORDER — ONDANSETRON HCL 8 MG PO TABS: 8.0000 mg | ORAL_TABLET | Freq: Two times a day (BID) | ORAL | 1 refills | Status: DC | PRN

## 2016-03-19 NOTE — Progress Notes (Signed)
Patient Care Team: [name], DO as PCP - General (Family Medicine) [name], MD as Consulting Physician (General Surgery) [name], MD as Consulting Physician (Hematology and Oncology) [name], MD as Consulting Physician (Radiation Oncology)  DIAGNOSIS:  Encounter Diagnosis  Name Primary?  . Malignant neoplasm of upper-outer quadrant of left breast in female, estrogen receptor negative ([place])    SUMMARY OF ONCOLOGIC HISTORY:   Malignant neoplasm of upper-outer quadrant of left breast in female, estrogen receptor negative ([place])   03/20/1995 Initial Biopsy    Left Lumpectomy with radiation and chemo (unknown)      02/09/2016 Initial Diagnosis    Left breast biopsy 2:00: IDC, left axillary lymph node biopsy negative, grade 3, ER 0%, PR 0%, Ki-67 40%, HER-2 negative ratio 1.31, screening detected left breast mass 1.8 cm, left axillary lymph node 5 mm benign, T1 CN 0 stage IA clinical stage      03/13/2016 Surgery    Left lumpectomy: IDC 2 cm, margins negative, 0/2 lymph nodes, grade 3, ER 0%, PR 0%, HER-2 negative ratio 1.31, Ki-67 40%, T1 CN 0 stage IA      CHIEF COMPLIANT: Follow-up after recent lumpectomy  INTERVAL HISTORY: Carla Pierce is a 46 year old with above-mentioned history of left breast cancer who underwent lumpectomy and is here today to discuss the results. She is recovering from the surgery fairly well. She does complain of mild discomfort in the breast which she is taking pain medication.  REVIEW OF SYSTEMS:   Constitutional: Denies fevers, chills or abnormal weight loss Eyes: Denies blurriness of vision Ears, nose, mouth, throat, and face: Denies mucositis or sore throat Respiratory: Denies cough, dyspnea or wheezes Cardiovascular: Denies palpitation, chest discomfort Gastrointestinal:  Denies nausea, heartburn or change in bowel habits Skin: Denies abnormal skin rashes Lymphatics: Denies new lymphadenopathy or easy  bruising Neurological:Denies numbness, tingling or new weaknesses Behavioral/Psych: Mood is stable, no new changes  Extremities: No lower extremity edema Breast: Recent left lumpectomy All other systems were reviewed with the patient and are negative.  I have reviewed the past medical history, past surgical history, social history and family history with the patient and they are unchanged from previous note.  ALLERGIES:  has No Known Allergies.  MEDICATIONS:  Current Outpatient Prescriptions  Medication Sig Dispense Refill  . AMLODIPINE BESYLATE PO Take 10 mg by mouth.    . cholecalciferol (VITAMIN D) 1000 units tablet Take 1,000 Units by mouth daily.    . fluticasone (FLONASE) 50 MCG/ACT nasal spray Place into both nostrils daily.    Marland Kitchen levothyroxine (SYNTHROID, LEVOTHROID) 200 MCG tablet Take 200 mcg by mouth daily before breakfast.    . levothyroxine (SYNTHROID, LEVOTHROID) 50 MCG tablet Take 50 mcg by mouth daily before breakfast.    . oxyCODONE (OXY IR/ROXICODONE) 5 MG immediate release tablet Take 1-2 tablets (5-10 mg total) by mouth every 6 (six) hours as needed for severe pain. 20 tablet 0  . promethazine (PHENERGAN) 25 MG/ML injection Inject 0.25-0.5 mLs (6.25-12.5 mg total) into the vein every 15 (fifteen) minutes as needed for nausea. 1 mL 0  . valsartan-hydrochlorothiazide (DIOVAN-HCT) 320-25 MG tablet Take 1 tablet by mouth daily.     No current facility-administered medications for this visit.     PHYSICAL EXAMINATION: ECOG PERFORMANCE STATUS: 1 - Symptomatic but completely ambulatory  Vitals:   03/19/16 1421  BP: 138/77  Pulse: 86  Resp: 20  Temp: 97.9 F (36.6 C)   Filed Weights   03/19/16 1421  Weight: 294 lb 8 oz (133.6 kg)    GENERAL:alert, no distress and comfortable SKIN: skin color, texture, turgor are normal, no rashes or significant lesions EYES: normal, Conjunctiva are pink and non-injected, sclera clear OROPHARYNX:no exudate, no erythema and lips,  buccal mucosa, and tongue normal  NECK: supple, thyroid normal size, non-tender, without nodularity LYMPH:  no palpable lymphadenopathy in the cervical, axillary or inguinal LUNGS: clear to auscultation and percussion with normal breathing effort HEART: regular rate & rhythm and no murmurs and no lower extremity edema ABDOMEN:abdomen soft, non-tender and normal bowel sounds MUSCULOSKELETAL:no cyanosis of digits and no clubbing  NEURO: alert & oriented x 3 with fluent speech, no focal motor/sensory deficits EXTREMITIES: No lower extremity edema  LABORATORY DATA:  I have reviewed the data as listed   Chemistry      Component Value Date/Time   NA 141 02/15/2016 1228   K 3.6 02/15/2016 1228   CO2 26 02/15/2016 1228   BUN 15.6 02/15/2016 1228   CREATININE 0.9 02/15/2016 1228      Component Value Date/Time   CALCIUM 9.8 02/15/2016 1228   ALKPHOS 98 02/15/2016 1228   AST 47 (H) 02/15/2016 1228   ALT 20 02/15/2016 1228   BILITOT 0.39 02/15/2016 1228       Lab Results  Component Value Date   WBC 6.5 02/15/2016   HGB 13.3 02/15/2016   HCT 40.9 02/15/2016   MCV 81.8 02/15/2016   PLT 315 02/15/2016   NEUTROABS 4.7 02/15/2016    ASSESSMENT & PLAN:  Malignant neoplasm of upper-outer quadrant of left breast in female, estrogen receptor negative ([facility]) Left lumpectomy 03/08/2016: IDC 2 cm, margins negative, 0/2 lymph nodes, grade 3, ER 0%, PR 0%, HER-2 negative ratio 1.31, Ki-67 40%, T1 CN 0 stage IA  Pathology counseling: I discussed the final pathology report of the patient provided  a copy of this report. I discussed the margins as well as lymph node surgeries. We also discussed the final staging along with previously performed ER/PR and HER-2/neu testing.  Recommendation: 1. Adjuvant chemotherapy with dose dense Adriamycin and Cytoxan (because a patient received chemotherapy 20 years ago, it is unclear what the total dose of Adriamycin was during that time. Because of this I plan  to give Adriamycin only for the first 3 cycles. For the fourth cycle she would only get Cytoxan. This will be  followed by weekly Taxol 12 2. Followed by radiation therapy  Patient will come back in 3 weeks to start chemotherapy.  I spent 25 minutes talking to the patient of which more than half was spent in counseling and coordination of care.  No orders of the defined types were placed in this encounter.  The patient has a good understanding of the overall plan. she agrees with it. she will call with any problems that may develop before the next visit here.   Rulon Eisenmenger, MD 03/19/16

## 2016-03-19 NOTE — Assessment & Plan Note (Signed)
Left lumpectomy 03/08/2016: IDC 2 cm, margins negative, 0/2 lymph nodes, grade 3, ER 0%, PR 0%, HER-2 negative ratio 1.31, Ki-67 40%, T1 CN 0 stage IA  Pathology counseling: I discussed the final pathology report of the patient provided  a copy of this report. I discussed the margins as well as lymph node surgeries. We also discussed the final staging along with previously performed ER/PR and HER-2/neu testing.  Recommendation: 1. Adjuvant chemotherapy with dose dense Adriamycin and Cytoxan 4 followed by weekly Taxol 12 2. Followed by radiation therapy  Patient will come back in 3 weeks to start chemotherapy

## 2016-03-19 NOTE — Progress Notes (Signed)
START ON PATHWAY REGIMEN - Breast   Doxorubicin + Cyclophosphamide [facility]):   A cycle is every 21 days:     Doxorubicin      Cyclophosphamide   **Always confirm dose/schedule in your pharmacy ordering system**    Paclitaxel 80 mg/m2 Weekly:   Administer weekly:     Paclitaxel   **Always confirm dose/schedule in your pharmacy ordering system**    Patient Characteristics: Adjuvant Therapy, Node Negative, HER2/neu Negative/Unknown/Equivocal, ER Negative AJCC Stage Grouping: IA Current Disease Status: No Distant Mets or Local Recurrence AJCC M Stage: 0 ER Status: Negative (-) AJCC N Stage: 0 AJCC T Stage: 1c HER2/neu: Negative (-) PR Status: Negative (-) Node Status: Negative (-)  Intent of Therapy: Curative Intent, Discussed with Patient

## 2016-03-29 ENCOUNTER — Encounter: Payer: Self-pay | Admitting: *Deleted

## 2016-03-29 ENCOUNTER — Other Ambulatory Visit: Payer: BLUE CROSS/BLUE SHIELD

## 2016-04-11 ENCOUNTER — Telehealth: Payer: Self-pay | Admitting: Hematology and Oncology

## 2016-04-11 NOTE — Telephone Encounter (Signed)
Call day - moved 4/17 appointments from PM to AM. Spoke with patient and she will get new schedule tomorrow.

## 2016-04-12 ENCOUNTER — Ambulatory Visit: Payer: BLUE CROSS/BLUE SHIELD

## 2016-04-12 ENCOUNTER — Ambulatory Visit (HOSPITAL_BASED_OUTPATIENT_CLINIC_OR_DEPARTMENT_OTHER): Payer: BLUE CROSS/BLUE SHIELD

## 2016-04-12 ENCOUNTER — Encounter: Payer: Self-pay | Admitting: Hematology and Oncology

## 2016-04-12 ENCOUNTER — Encounter: Payer: Self-pay | Admitting: *Deleted

## 2016-04-12 ENCOUNTER — Other Ambulatory Visit (HOSPITAL_BASED_OUTPATIENT_CLINIC_OR_DEPARTMENT_OTHER): Payer: BLUE CROSS/BLUE SHIELD

## 2016-04-12 ENCOUNTER — Ambulatory Visit (HOSPITAL_BASED_OUTPATIENT_CLINIC_OR_DEPARTMENT_OTHER): Payer: BLUE CROSS/BLUE SHIELD | Admitting: Adult Health

## 2016-04-12 ENCOUNTER — Encounter: Payer: Self-pay | Admitting: Adult Health

## 2016-04-12 VITALS — BP 101/82 | HR 88 | Temp 98.4°F | Resp 20 | Wt 289.8 lb

## 2016-04-12 DIAGNOSIS — Z171 Estrogen receptor negative status [ER-]: Principal | ICD-10-CM

## 2016-04-12 DIAGNOSIS — C50412 Malignant neoplasm of upper-outer quadrant of left female breast: Secondary | ICD-10-CM

## 2016-04-12 DIAGNOSIS — Z95828 Presence of other vascular implants and grafts: Secondary | ICD-10-CM | POA: Insufficient documentation

## 2016-04-12 DIAGNOSIS — Z5111 Encounter for antineoplastic chemotherapy: Secondary | ICD-10-CM | POA: Diagnosis not present

## 2016-04-12 DIAGNOSIS — Z8589 Personal history of malignant neoplasm of other organs and systems: Secondary | ICD-10-CM | POA: Diagnosis not present

## 2016-04-12 LAB — CBC WITH DIFFERENTIAL/PLATELET
BASO%: 0.5 % (ref 0.0–2.0)
Basophils Absolute: 0 10*3/uL (ref 0.0–0.1)
EOS%: 2.6 % (ref 0.0–7.0)
Eosinophils Absolute: 0.1 10*3/uL (ref 0.0–0.5)
HCT: 40.9 % (ref 34.8–46.6)
HGB: 13.5 g/dL (ref 11.6–15.9)
LYMPH%: 22.2 % (ref 14.0–49.7)
MCH: 26.4 pg (ref 25.1–34.0)
MCHC: 32.9 g/dL (ref 31.5–36.0)
MCV: 80.2 fL (ref 79.5–101.0)
MONO#: 0.4 10*3/uL (ref 0.1–0.9)
MONO%: 10.7 % (ref 0.0–14.0)
NEUT%: 64 % (ref 38.4–76.8)
NEUTROS ABS: 2.1 10*3/uL (ref 1.5–6.5)
PLATELETS: 256 10*3/uL (ref 145–400)
RBC: 5.1 10*6/uL (ref 3.70–5.45)
RDW: 15.9 % — ABNORMAL HIGH (ref 11.2–14.5)
WBC: 3.3 10*3/uL — AB (ref 3.9–10.3)
lymph#: 0.7 10*3/uL — ABNORMAL LOW (ref 0.9–3.3)

## 2016-04-12 LAB — COMPREHENSIVE METABOLIC PANEL
ALT: 26 U/L (ref 0–55)
AST: 43 U/L — ABNORMAL HIGH (ref 5–34)
Albumin: 3.6 g/dL (ref 3.5–5.0)
Alkaline Phosphatase: 105 U/L (ref 40–150)
Anion Gap: 11 mEq/L (ref 3–11)
BILIRUBIN TOTAL: 0.23 mg/dL (ref 0.20–1.20)
BUN: 16.9 mg/dL (ref 7.0–26.0)
CO2: 27 meq/L (ref 22–29)
CREATININE: 1 mg/dL (ref 0.6–1.1)
Calcium: 9.4 mg/dL (ref 8.4–10.4)
Chloride: 102 mEq/L (ref 98–109)
EGFR: 83 mL/min/{1.73_m2} — ABNORMAL LOW (ref >90)
Glucose: 103 mg/dl (ref 70–140)
Potassium: 3.3 mEq/L — ABNORMAL LOW (ref 3.5–5.1)
Sodium: 141 mEq/L (ref 136–145)
TOTAL PROTEIN: 7.5 g/dL (ref 6.4–8.3)

## 2016-04-12 MED ORDER — HEPARIN SOD (PORK) LOCK FLUSH 100 UNIT/ML IV SOLN
500.0000 [IU] | Freq: Once | INTRAVENOUS | Status: AC | PRN
  Administered 2016-04-12: 500 [IU]
  Filled 2016-04-12: qty 5

## 2016-04-12 MED ORDER — SODIUM CHLORIDE 0.9% FLUSH
10.0000 mL | INTRAVENOUS | Status: DC | PRN
  Filled 2016-04-12: qty 10

## 2016-04-12 MED ORDER — SODIUM CHLORIDE 0.9 % IV SOLN
Freq: Once | INTRAVENOUS | Status: AC
  Administered 2016-04-12: 13:00:00 via INTRAVENOUS
  Filled 2016-04-12: qty 5

## 2016-04-12 MED ORDER — PALONOSETRON HCL INJECTION 0.25 MG/5ML
INTRAVENOUS | Status: AC
  Filled 2016-04-12: qty 5

## 2016-04-12 MED ORDER — PALONOSETRON HCL INJECTION 0.25 MG/5ML
0.2500 mg | Freq: Once | INTRAVENOUS | Status: AC
  Administered 2016-04-12: 0.25 mg via INTRAVENOUS

## 2016-04-12 MED ORDER — SODIUM CHLORIDE 0.9 % IV SOLN
600.0000 mg/m2 | Freq: Once | INTRAVENOUS | Status: AC
  Administered 2016-04-12: 1540 mg via INTRAVENOUS
  Filled 2016-04-12: qty 77

## 2016-04-12 MED ORDER — SODIUM CHLORIDE 0.9 % IV SOLN
Freq: Once | INTRAVENOUS | Status: AC
  Administered 2016-04-12: 12:00:00 via INTRAVENOUS

## 2016-04-12 MED ORDER — PEGFILGRASTIM 6 MG/0.6ML ~~LOC~~ PSKT
6.0000 mg | PREFILLED_SYRINGE | Freq: Once | SUBCUTANEOUS | Status: AC
  Administered 2016-04-12: 6 mg via SUBCUTANEOUS
  Filled 2016-04-12: qty 0.6

## 2016-04-12 MED ORDER — DOXORUBICIN HCL CHEMO IV INJECTION 2 MG/ML
60.0000 mg/m2 | Freq: Once | INTRAVENOUS | Status: AC
  Administered 2016-04-12: 154 mg via INTRAVENOUS
  Filled 2016-04-12: qty 77

## 2016-04-12 MED ORDER — SODIUM CHLORIDE 0.9% FLUSH
10.0000 mL | INTRAVENOUS | Status: AC | PRN
  Administered 2016-04-12 (×2): 10 mL via INTRAVENOUS
  Filled 2016-04-12: qty 10

## 2016-04-12 NOTE — Progress Notes (Signed)
Patient tolerated treatment well. Neulasta video and instructions reviewed with patient as well as discharge instructions. Patient verbalized understanding. Patient stable upon discharge.

## 2016-04-12 NOTE — Patient Instructions (Addendum)
[facility] Discharge Instructions for Patients Receiving Chemotherapy  Today you received the following chemotherapy agents: Adriamycin and Cytoxan   To help prevent nausea and vomiting after your treatment, we encourage you to take your nausea medication as directed. You received Aloxi today for nausea, so DO NOT take Zofran for three days; may resume taking Zofran on 4/14.    If you develop nausea and vomiting that is not controlled by your nausea medication, call the clinic.   BELOW ARE SYMPTOMS THAT SHOULD BE REPORTED IMMEDIATELY:  *FEVER GREATER THAN 100.5 F  *CHILLS WITH OR WITHOUT FEVER  NAUSEA AND VOMITING THAT IS NOT CONTROLLED WITH YOUR NAUSEA MEDICATION  *UNUSUAL SHORTNESS OF BREATH  *UNUSUAL BRUISING OR BLEEDING  TENDERNESS IN MOUTH AND THROAT WITH OR WITHOUT PRESENCE OF ULCERS  *URINARY PROBLEMS  *BOWEL PROBLEMS  UNUSUAL RASH Items with * indicate a potential emergency and should be followed up as soon as possible.  Feel free to call the clinic you have any questions or concerns. The clinic phone number is [phone].  Please show the [facility] at check-in to the Emergency Department and triage nurse.  Doxorubicin injection (Adriamycin)  What is this medicine? DOXORUBICIN (dox oh ROO bi sin) is a chemotherapy drug. It is used to treat many kinds of cancer like leukemia, lymphoma, neuroblastoma, sarcoma, and Wilms' tumor. It is also used to treat bladder cancer, breast cancer, lung cancer, ovarian cancer, stomach cancer, and thyroid cancer. This medicine may be used for other purposes; ask your health care provider or pharmacist if you have questions. COMMON BRAND NAME(S): Adriamycin, Adriamycin PFS, Adriamycin RDF, Rubex What should I tell my health care provider before I take this medicine? They need to know if you have any of these conditions: -heart disease -history of low blood counts caused by a medicine -liver disease -recent  or ongoing radiation therapy -an unusual or allergic reaction to doxorubicin, other chemotherapy agents, other medicines, foods, dyes, or preservatives -pregnant or trying to get pregnant -breast-feeding How should I use this medicine? This drug is given as an infusion into a vein. It is administered in a hospital or clinic by a specially trained health care professional. If you have pain, swelling, burning or any unusual feeling around the site of your injection, tell your health care professional right away. Talk to your pediatrician regarding the use of this medicine in children. Special care may be needed. Overdosage: If you think you have taken too much of this medicine contact a poison control center or emergency room at once. NOTE: This medicine is only for you. Do not share this medicine with others. What if I miss a dose? It is important not to miss your dose. Call your doctor or health care professional if you are unable to keep an appointment. What may interact with this medicine? This medicine may interact with the following medications: -6-mercaptopurine -paclitaxel -phenytoin -St. John's Wort -trastuzumab -verapamil This list may not describe all possible interactions. Give your health care provider a list of all the medicines, herbs, non-prescription drugs, or dietary supplements you use. Also tell them if you smoke, drink alcohol, or use illegal drugs. Some items may interact with your medicine. What should I watch for while using this medicine? This drug may make you feel generally unwell. This is not uncommon, as chemotherapy can affect healthy cells as well as cancer cells. Report any side effects. Continue your course of treatment even though you feel ill unless your  doctor tells you to stop. There is a maximum amount of this medicine you should receive throughout your life. The amount depends on the medical condition being treated and your overall health. Your doctor will  watch how much of this medicine you receive in your lifetime. Tell your doctor if you have taken this medicine before. You may need blood work done while you are taking this medicine. Your urine may turn red for a few days after your dose. This is not blood. If your urine is dark or brown, call your doctor. In some cases, you may be given additional medicines to help with side effects. Follow all directions for their use. Call your doctor or health care professional for advice if you get a fever, chills or sore throat, or other symptoms of a cold or flu. Do not treat yourself. This drug decreases your body's ability to fight infections. Try to avoid being around people who are sick. This medicine may increase your risk to bruise or bleed. Call your doctor or health care professional if you notice any unusual bleeding. Talk to your doctor about your risk of cancer. You may be more at risk for certain types of cancers if you take this medicine. Do not become pregnant while taking this medicine or for 6 months after stopping it. Women should inform their doctor if they wish to become pregnant or think they might be pregnant. Men should not father a child while taking this medicine and for 6 months after stopping it. There is a potential for serious side effects to an unborn child. Talk to your health care professional or pharmacist for more information. Do not breast-feed an infant while taking this medicine. This medicine has caused ovarian failure in some women and reduced sperm counts in some men This medicine may interfere with the ability to have a child. Talk with your doctor or health care professional if you are concerned about your fertility. What side effects may I notice from receiving this medicine? Side effects that you should report to your doctor or health care professional as soon as possible: -allergic reactions like skin rash, itching or hives, swelling of the face, lips, or  tongue -breathing problems -chest pain -fast or irregular heartbeat -low blood counts - this medicine may decrease the number of white blood cells, red blood cells and platelets. You may be at increased risk for infections and bleeding. -pain, redness, or irritation at site where injected -signs of infection - fever or chills, cough, sore throat, pain or difficulty passing urine -signs of decreased platelets or bleeding - bruising, pinpoint red spots on the skin, black, tarry stools, blood in the urine -swelling of the ankles, feet, hands -tiredness -weakness Side effects that usually do not require medical attention (report to your doctor or health care professional if they continue or are bothersome): -diarrhea -hair loss -mouth sores -nail discoloration or damage -nausea -red colored urine -vomiting This list may not describe all possible side effects. Call your doctor for medical advice about side effects. You may report side effects to FDA at 1-800-FDA-1088. Where should I keep my medicine? This drug is given in a hospital or clinic and will not be stored at home. NOTE: This sheet is a summary. It may not cover all possible information. If you have questions about this medicine, talk to your doctor, pharmacist, or health care provider.  2018 Elsevier/Gold Standard (2015-02-14 11:28:51)  Cyclophosphamide injection (Cytoxan) What is this medicine? CYCLOPHOSPHAMIDE (sye kloe FOSS fa  mide) is a chemotherapy drug. It slows the growth of cancer cells. This medicine is used to treat many types of cancer like lymphoma, myeloma, leukemia, breast cancer, and ovarian cancer, to name a few. This medicine may be used for other purposes; ask your health care provider or pharmacist if you have questions. COMMON BRAND NAME(S): Cytoxan, Neosar What should I tell my health care provider before I take this medicine? They need to know if you have any of these conditions: -blood disorders -history  of other chemotherapy -infection -kidney disease -liver disease -recent or ongoing radiation therapy -tumors in the bone marrow -an unusual or allergic reaction to cyclophosphamide, other chemotherapy, other medicines, foods, dyes, or preservatives -pregnant or trying to get pregnant -breast-feeding How should I use this medicine? This drug is usually given as an injection into a vein or muscle or by infusion into a vein. It is administered in a hospital or clinic by a specially trained health care professional. Talk to your pediatrician regarding the use of this medicine in children. Special care may be needed. Overdosage: If you think you have taken too much of this medicine contact a poison control center or emergency room at once. NOTE: This medicine is only for you. Do not share this medicine with others. What if I miss a dose? It is important not to miss your dose. Call your doctor or health care professional if you are unable to keep an appointment. What may interact with this medicine? This medicine may interact with the following medications: -amiodarone -amphotericin B -azathioprine -certain antiviral medicines for HIV or AIDS such as protease inhibitors (e.g., indinavir, ritonavir) and zidovudine -certain blood pressure medications such as benazepril, captopril, enalapril, fosinopril, lisinopril, moexipril, monopril, perindopril, quinapril, ramipril, trandolapril -certain cancer medications such as anthracyclines (e.g., daunorubicin, doxorubicin), busulfan, cytarabine, paclitaxel, pentostatin, tamoxifen, trastuzumab -certain diuretics such as chlorothiazide, chlorthalidone, hydrochlorothiazide, indapamide, metolazone -certain medicines that treat or prevent blood clots like warfarin -certain muscle relaxants such as succinylcholine -cyclosporine -etanercept -indomethacin -medicines to increase blood counts like filgrastim, pegfilgrastim, sargramostim -medicines used as  general anesthesia -metronidazole -natalizumab This list may not describe all possible interactions. Give your health care provider a list of all the medicines, herbs, non-prescription drugs, or dietary supplements you use. Also tell them if you smoke, drink alcohol, or use illegal drugs. Some items may interact with your medicine. What should I watch for while using this medicine? Visit your doctor for checks on your progress. This drug may make you feel generally unwell. This is not uncommon, as chemotherapy can affect healthy cells as well as cancer cells. Report any side effects. Continue your course of treatment even though you feel ill unless your doctor tells you to stop. Drink water or other fluids as directed. Urinate often, even at night. In some cases, you may be given additional medicines to help with side effects. Follow all directions for their use. Call your doctor or health care professional for advice if you get a fever, chills or sore throat, or other symptoms of a cold or flu. Do not treat yourself. This drug decreases your body's ability to fight infections. Try to avoid being around people who are sick. This medicine may increase your risk to bruise or bleed. Call your doctor or health care professional if you notice any unusual bleeding. Be careful brushing and flossing your teeth or using a toothpick because you may get an infection or bleed more easily. If you have any dental work done, tell your  dentist you are receiving this medicine. You may get drowsy or dizzy. Do not drive, use machinery, or do anything that needs mental alertness until you know how this medicine affects you. Do not become pregnant while taking this medicine or for 1 year after stopping it. Women should inform their doctor if they wish to become pregnant or think they might be pregnant. Men should not father a child while taking this medicine and for 4 months after stopping it. There is a potential for  serious side effects to an unborn child. Talk to your health care professional or pharmacist for more information. Do not breast-feed an infant while taking this medicine. This medicine may interfere with the ability to have a child. This medicine has caused ovarian failure in some women. This medicine has caused reduced sperm counts in some men. You should talk with your doctor or health care professional if you are concerned about your fertility. If you are going to have surgery, tell your doctor or health care professional that you have taken this medicine. What side effects may I notice from receiving this medicine? Side effects that you should report to your doctor or health care professional as soon as possible: -allergic reactions like skin rash, itching or hives, swelling of the face, lips, or tongue -low blood counts - this medicine may decrease the number of white blood cells, red blood cells and platelets. You may be at increased risk for infections and bleeding. -signs of infection - fever or chills, cough, sore throat, pain or difficulty passing urine -signs of decreased platelets or bleeding - bruising, pinpoint red spots on the skin, black, tarry stools, blood in the urine -signs of decreased red blood cells - unusually weak or tired, fainting spells, lightheadedness -breathing problems -dark urine -dizziness -palpitations -swelling of the ankles, feet, hands -trouble passing urine or change in the amount of urine -weight gain -yellowing of the eyes or skin Side effects that usually do not require medical attention (report to your doctor or health care professional if they continue or are bothersome): -changes in nail or skin color -hair loss -missed menstrual periods -mouth sores -nausea, vomiting This list may not describe all possible side effects. Call your doctor for medical advice about side effects. You may report side effects to FDA at 1-800-FDA-1088. Where should I  keep my medicine? This drug is given in a hospital or clinic and will not be stored at home. NOTE: This sheet is a summary. It may not cover all possible information. If you have questions about this medicine, talk to your doctor, pharmacist, or health care provider.  2018 Elsevier/Gold Standard (2011-11-02 16:22:58)

## 2016-04-12 NOTE — Progress Notes (Signed)
Patient Care Team: [name], DO as PCP - General (Family Medicine) [name], MD as Consulting Physician (General Surgery) [name], MD as Consulting Physician (Hematology and Oncology) [name], MD as Consulting Physician (Radiation Oncology) Gardenia Phlegm, NP as Nurse Practitioner (Hematology and Oncology)  DIAGNOSIS:  Encounter Diagnosis  Name Primary?  . Malignant neoplasm of upper-outer quadrant of left breast in female, estrogen receptor negative ([place]) Yes   SUMMARY OF ONCOLOGIC HISTORY:   Malignant neoplasm of upper-outer quadrant of left breast in female, estrogen receptor negative ([place])   03/20/1995 Initial Biopsy    Head and Neck Cancer      02/09/2016 Initial Diagnosis    Left breast biopsy 2:00: IDC, left axillary lymph node biopsy negative, grade 3, ER 0%, PR 0%, Ki-67 40%, HER-2 negative ratio 1.31, screening detected left breast mass 1.8 cm, left axillary lymph node 5 mm benign, T1 CN 0 stage IA clinical stage      03/13/2016 Surgery    Left lumpectomy: IDC 2 cm, margins negative, 0/2 lymph nodes, grade 3, ER 0%, PR 0%, HER-2 negative ratio 1.31, Ki-67 40%, T1 CN 0 stage IA      CHIEF COMPLIANT: Adjuvant Adriamycin and Cytoxan cycle 1 day 1  INTERVAL HISTORY: Carla Pierce is a 46 year old who is here today prior to receiving chemotherapy.  She is doing well today.  She is continuing to adjust to her port.  She has some allergies, and post nasal drainage and is planning on starting claritin today.    REVIEW OF SYSTEMS:   Review of Systems  Constitutional: Negative for chills, diaphoresis, fever, malaise/fatigue and weight loss.  HENT: Negative for hearing loss and tinnitus.   Eyes: Negative for blurred vision, double vision and discharge.  Respiratory: Negative for cough and shortness of breath.   Cardiovascular: Negative for chest pain, palpitations and leg swelling.  Gastrointestinal: Negative for abdominal pain, blood in stool,  constipation, diarrhea, heartburn, melena, nausea and vomiting.  Neurological: Negative for dizziness, tingling, focal weakness, weakness and headaches.  Endo/Heme/Allergies: Negative for environmental allergies. Does not bruise/bleed easily.     I have reviewed the past medical history, past surgical history, social history and family history with the patient and they are unchanged from previous note.  ALLERGIES:  has No Known Allergies.  MEDICATIONS:  Current Outpatient Prescriptions  Medication Sig Dispense Refill  . AMLODIPINE BESYLATE PO Take 10 mg by mouth.    . cholecalciferol (VITAMIN D) 1000 units tablet Take 1,000 Units by mouth daily.    . fluticasone (FLONASE) 50 MCG/ACT nasal spray Place into both nostrils daily.    Marland Kitchen levothyroxine (SYNTHROID, LEVOTHROID) 200 MCG tablet Take 200 mcg by mouth daily before breakfast.    . levothyroxine (SYNTHROID, LEVOTHROID) 50 MCG tablet Take 50 mcg by mouth daily before breakfast.    . lidocaine-prilocaine (EMLA) cream Apply to affected area once 30 g 3  . loratadine (CLARITIN) 10 MG tablet Take 10 mg by mouth daily.    Marland Kitchen LORazepam (ATIVAN) 0.5 MG tablet Take 1 tablet (0.5 mg total) by mouth at bedtime. 30 tablet 0  . Naproxen Sodium (ALEVE) 220 MG CAPS Take 1 capsule by mouth. As needed for pain    . ondansetron (ZOFRAN) 8 MG tablet Take 1 tablet (8 mg total) by mouth 2 (two) times daily as needed. Start on the third day after chemotherapy. 30 tablet 1  . oxyCODONE (OXY IR/ROXICODONE) 5 MG immediate release tablet Take 1-2 tablets (5-10 mg  total) by mouth every 6 (six) hours as needed for severe pain. 20 tablet 0  . prochlorperazine (COMPAZINE) 10 MG tablet Take 1 tablet (10 mg total) by mouth every 6 (six) hours as needed (Nausea or vomiting). 30 tablet 1  . promethazine (PHENERGAN) 25 MG/ML injection Inject 0.25-0.5 mLs (6.25-12.5 mg total) into the vein every 15 (fifteen) minutes as needed for nausea. 1 mL 0  .  valsartan-hydrochlorothiazide (DIOVAN-HCT) 320-25 MG tablet Take 1 tablet by mouth daily.     No current facility-administered medications for this visit.    Facility-Administered Medications Ordered in Other Visits  Medication Dose Route Frequency Provider Last Rate Last Dose  . cyclophosphamide (CYTOXAN) 1,540 mg in sodium chloride 0.9 % 250 mL chemo infusion  600 mg/m2 (Treatment Plan Recorded) Intravenous Once [name], MD      . DOXOrubicin (ADRIAMYCIN) chemo injection 154 mg  60 mg/m2 (Treatment Plan Recorded) Intravenous Once [name], MD      . heparin lock flush 100 unit/mL  500 Units Intracatheter Once PRN [name], MD      . pegfilgrastim (NEULASTA ONPRO KIT) injection 6 mg  6 mg Subcutaneous Once [name], MD      . sodium chloride flush (NS) 0.9 % injection 10 mL  10 mL Intravenous PRN [name], MD   10 mL at 04/12/16 1109  . sodium chloride flush (NS) 0.9 % injection 10 mL  10 mL Intracatheter PRN [name], MD        PHYSICAL EXAMINATION: ECOG PERFORMANCE STATUS: 1 - Symptomatic but completely ambulatory  Vitals:   04/12/16 1131  BP: 101/82  Pulse: 88  Resp: 20  Temp: 98.4 F (36.9 C)   Filed Weights   04/12/16 1131  Weight: 289 lb 12.8 oz (131.5 kg)  GENERAL: Patient is a well appearing female in no acute distress HEENT:  Sclerae anicteric.  PERRL  Oropharynx clear and moist. No ulcerations or evidence of oropharyngeal candidiasis. Neck is supple.  NODES:  No cervical, supraclavicular, or axillary lymphadenopathy palpated.  BREAST EXAM:  Left lumpectomy site well healed LUNGS:  Clear to auscultation bilaterally.  No wheezes or rhonchi. HEART:  Regular rate and rhythm. No murmur appreciated. ABDOMEN:  Soft, nontender.  Positive, normoactive bowel sounds. No organomegaly palpated. MSK:  No focal spinal tenderness to palpation. Full range of motion bilaterally in the upper extremities. EXTREMITIES:  No peripheral edema.   SKIN:  Clear with no  obvious rashes or skin changes. No nail dyscrasia. NEURO:  Nonfocal. Well oriented.  Appropriate affect.   LABORATORY DATA:  I have reviewed the data as listed   Chemistry      Component Value Date/Time   NA 141 04/12/2016 1102   K 3.3 (L) 04/12/2016 1102   CO2 27 04/12/2016 1102   BUN 16.9 04/12/2016 1102   CREATININE 1.0 04/12/2016 1102      Component Value Date/Time   CALCIUM 9.4 04/12/2016 1102   ALKPHOS 105 04/12/2016 1102   AST 43 (H) 04/12/2016 1102   ALT 26 04/12/2016 1102   BILITOT 0.23 04/12/2016 1102       Lab Results  Component Value Date   WBC 3.3 (L) 04/12/2016   HGB 13.5 04/12/2016   HCT 40.9 04/12/2016   MCV 80.2 04/12/2016   PLT 256 04/12/2016   NEUTROABS 2.1 04/12/2016    ASSESSMENT & PLAN:  Malignant neoplasm of upper-outer quadrant of left breast in female, estrogen receptor negative ([place]) Left lumpectomy 03/08/2016: IDC 2 cm,  margins negative, 0/2 lymph nodes, grade 3, ER 0%, PR 0%, HER-2 negative ratio 1.31, Ki-67 40%, T1 CN 0 stage IA  Pathology counseling: I discussed the final pathology report of the patient provided  a copy of this report. I discussed the margins as well as lymph node surgeries. We also discussed the final staging along with previously performed ER/PR and HER-2/neu testing.  Recommendation: 1. Adjuvant chemotherapy with dose dense Adriamycin and Cytoxan (because a patient received chemotherapy 20 years ago, it is unclear what the total dose of Adriamycin was during that time. Because of this I plan to give Adriamycin only for the first 3 cycles. For the fourth cycle she would only get Cytoxan. This will be  followed by weekly Taxol 12 2. Followed by radiation therapy  Willard is doing well today.  I reviewed her chemotherapy, anti-nausea regimen, neulasta/onpro with her in detail.  She will proceed with chemothearpy.  LVEF on last echo on 03/08/2016 was 60-65%.  I think she would be a good candidate for evaluation with our  cardio onc clinic due to her h/o having received Adriamycin previously.  She is agreeable.  She has f/u scheduled in one week with Dr. Lindi Adie.    I spent 25 minutes talking to the patient of which more than half was spent in counseling and coordination of care.  Orders Placed This Encounter  Procedures  . Ambulatory referral to Cardiology    Referral Priority:   Urgent    Referral Type:   Consultation    Referral Reason:   Specialty Services Required    Referred to Provider:   Jolaine Artist, MD    Requested Specialty:   Cardiology    Number of Visits Requested:   1   The patient has a good understanding of the overall plan. she agrees with it. she will call with any problems that may develop before the next visit here.   Scot Dock, NP 04/12/16

## 2016-04-13 ENCOUNTER — Telehealth: Payer: Self-pay

## 2016-04-13 NOTE — Telephone Encounter (Signed)
Called pt follow up 1st day after chemo. Pt doing well. Reviewed all medications with pt and is taking as directed. Pt encouraged to drink plenty of fluids over the next few days and to modify diet appropriately to prevent nausea/vomiting. Pt verbalized understanding and is doing well. Pt reminded that she will be back for nadir check in 1 week.

## 2016-04-17 ENCOUNTER — Ambulatory Visit (HOSPITAL_BASED_OUTPATIENT_CLINIC_OR_DEPARTMENT_OTHER): Payer: BLUE CROSS/BLUE SHIELD | Admitting: Hematology and Oncology

## 2016-04-17 ENCOUNTER — Other Ambulatory Visit (HOSPITAL_BASED_OUTPATIENT_CLINIC_OR_DEPARTMENT_OTHER): Payer: BLUE CROSS/BLUE SHIELD

## 2016-04-17 ENCOUNTER — Encounter: Payer: Self-pay | Admitting: *Deleted

## 2016-04-17 ENCOUNTER — Encounter: Payer: Self-pay | Admitting: Hematology and Oncology

## 2016-04-17 ENCOUNTER — Ambulatory Visit (HOSPITAL_BASED_OUTPATIENT_CLINIC_OR_DEPARTMENT_OTHER): Payer: BLUE CROSS/BLUE SHIELD

## 2016-04-17 DIAGNOSIS — Z171 Estrogen receptor negative status [ER-]: Secondary | ICD-10-CM | POA: Diagnosis not present

## 2016-04-17 DIAGNOSIS — R5383 Other fatigue: Secondary | ICD-10-CM

## 2016-04-17 DIAGNOSIS — C50412 Malignant neoplasm of upper-outer quadrant of left female breast: Secondary | ICD-10-CM

## 2016-04-17 DIAGNOSIS — Z95828 Presence of other vascular implants and grafts: Secondary | ICD-10-CM

## 2016-04-17 LAB — COMPREHENSIVE METABOLIC PANEL
ALT: 27 U/L (ref 0–55)
ANION GAP: 11 meq/L (ref 3–11)
AST: 35 U/L — AB (ref 5–34)
Albumin: 3.5 g/dL (ref 3.5–5.0)
Alkaline Phosphatase: 114 U/L (ref 40–150)
BILIRUBIN TOTAL: 0.61 mg/dL (ref 0.20–1.20)
BUN: 16.7 mg/dL (ref 7.0–26.0)
CALCIUM: 9.5 mg/dL (ref 8.4–10.4)
CO2: 29 mEq/L (ref 22–29)
CREATININE: 0.8 mg/dL (ref 0.6–1.1)
Chloride: 99 mEq/L (ref 98–109)
EGFR: >90 mL/min/{1.73_m2} (ref >90)
Glucose: 93 mg/dl (ref 70–140)
Potassium: 3.5 mEq/L (ref 3.5–5.1)
Sodium: 138 mEq/L (ref 136–145)
Total Protein: 7 g/dL (ref 6.4–8.3)

## 2016-04-17 LAB — CBC WITH DIFFERENTIAL/PLATELET
BASO%: 0.2 % (ref 0.0–2.0)
BASOS ABS: 0 10*3/uL (ref 0.0–0.1)
EOS%: 1.9 % (ref 0.0–7.0)
Eosinophils Absolute: 0.1 10*3/uL (ref 0.0–0.5)
HEMATOCRIT: 41 % (ref 34.8–46.6)
HEMOGLOBIN: 13.4 g/dL (ref 11.6–15.9)
LYMPH#: 0.4 10*3/uL — AB (ref 0.9–3.3)
LYMPH%: 7.9 % — ABNORMAL LOW (ref 14.0–49.7)
MCH: 26.1 pg (ref 25.1–34.0)
MCHC: 32.7 g/dL (ref 31.5–36.0)
MCV: 79.7 fL (ref 79.5–101.0)
MONO#: 0 10*3/uL — AB (ref 0.1–0.9)
MONO%: 0.6 % (ref 0.0–14.0)
NEUT#: 4 10*3/uL (ref 1.5–6.5)
NEUT%: 89.4 % — AB (ref 38.4–76.8)
PLATELETS: 183 10*3/uL (ref 145–400)
RBC: 5.15 10*6/uL (ref 3.70–5.45)
RDW: 16.2 % — ABNORMAL HIGH (ref 11.2–14.5)
WBC: 4.5 10*3/uL (ref 3.9–10.3)

## 2016-04-17 MED ORDER — HEPARIN SOD (PORK) LOCK FLUSH 100 UNIT/ML IV SOLN
500.0000 [IU] | Freq: Once | INTRAVENOUS | Status: AC | PRN
  Administered 2016-04-17: 500 [IU] via INTRAVENOUS
  Filled 2016-04-17: qty 5

## 2016-04-17 MED ORDER — SODIUM CHLORIDE 0.9% FLUSH
10.0000 mL | INTRAVENOUS | Status: DC | PRN
  Administered 2016-04-17: 10 mL via INTRAVENOUS
  Filled 2016-04-17: qty 10

## 2016-04-17 NOTE — Assessment & Plan Note (Signed)
Left lumpectomy 03/08/2016: IDC 2 cm, margins negative, 0/2 lymph nodes, grade 3, ER 0%, PR 0%, HER-2 negative ratio 1.31, Ki-67 40%, T1 CN 0 stage IA  Treatment plan: 1. Adjuvant chemotherapy with dose dense Adriamycin and Cytoxan 4 followed by weekly Taxol 12 started 04/17/2016 2. Followed by radiation therapy ----------------------------------------------------------------------------------------------------------------------------------------- Current treatment: Cycle 1 day 1 dose dense Adriamycin Cytoxan Antiemetics were reviewed Chemotherapy consent obtained Chemotherapy education completed Echocardiogram 03/06/2016: EF 60-65% Closely monitoring for chemotherapy toxicities.  Return to clinic in one week for toxicity check

## 2016-04-17 NOTE — Patient Instructions (Signed)

## 2016-04-17 NOTE — Progress Notes (Signed)
Patient Care Team: [name], DO as PCP - General (Family Medicine) [name], MD as Consulting Physician (General Surgery) [name], MD as Consulting Physician (Hematology and Oncology) [name], MD as Consulting Physician (Radiation Oncology) Gardenia Phlegm, NP as Nurse Practitioner (Hematology and Oncology)  DIAGNOSIS:  Encounter Diagnosis  Name Primary?  . Malignant neoplasm of upper-outer quadrant of left breast in female, estrogen receptor negative ([place])     SUMMARY OF ONCOLOGIC HISTORY:   Malignant neoplasm of upper-outer quadrant of left breast in female, estrogen receptor negative ([place])   03/20/1995 Initial Biopsy    Head and Neck Cancer      02/09/2016 Initial Diagnosis    Left breast biopsy 2:00: IDC, left axillary lymph node biopsy negative, grade 3, ER 0%, PR 0%, Ki-67 40%, HER-2 negative ratio 1.31, screening detected left breast mass 1.8 cm, left axillary lymph node 5 mm benign, T1 CN 0 stage IA clinical stage      03/13/2016 Surgery    Left lumpectomy: IDC 2 cm, margins negative, 0/2 lymph nodes, grade 3, ER 0%, PR 0%, HER-2 negative ratio 1.31, Ki-67 40%, T1 CN 0 stage IA      04/10/2016 -  Chemotherapy    Adjuvant chemotherapy with dose dense Adriamycin and Cytoxan followed by Taxol weekly 12       CHIEF COMPLIANT: Cycle 1 day 8 dose dense Adriamycin and Cytoxan  INTERVAL HISTORY: Annarae Macnair is a 46 year old with above-mentioned history left breast cancer treated with lumpectomy and is here today for toxicity check after receiving cycle 1 of dose dense Adriamycin and Cytoxan last week. She reports that overall she felt fairly well. She did not have any significant nausea vomiting. She has felt more tired but it has recovered.  REVIEW OF SYSTEMS:   Constitutional: Denies fevers, chills or abnormal weight loss Eyes: Denies blurriness of vision Ears, nose, mouth, throat, and face: Denies mucositis or sore throat Respiratory:  Denies cough, dyspnea or wheezes Cardiovascular: Denies palpitation, chest discomfort Gastrointestinal:  Nausea and tiredness Skin: Denies abnormal skin rashes Lymphatics: Denies new lymphadenopathy or easy bruising Neurological:Denies numbness, tingling or new weaknesses Behavioral/Psych: Mood is stable, no new changes  Extremities: No lower extremity edema All other systems were reviewed with the patient and are negative.  I have reviewed the past medical history, past surgical history, social history and family history with the patient and they are unchanged from previous note.  ALLERGIES:  has No Known Allergies.  MEDICATIONS:  Current Outpatient Prescriptions  Medication Sig Dispense Refill  . AMLODIPINE BESYLATE PO Take 10 mg by mouth.    . cholecalciferol (VITAMIN D) 1000 units tablet Take 1,000 Units by mouth daily.    . fluticasone (FLONASE) 50 MCG/ACT nasal spray Place into both nostrils daily.    Marland Kitchen levothyroxine (SYNTHROID, LEVOTHROID) 200 MCG tablet Take 200 mcg by mouth daily before breakfast.    . levothyroxine (SYNTHROID, LEVOTHROID) 50 MCG tablet Take 50 mcg by mouth daily before breakfast.    . lidocaine-prilocaine (EMLA) cream Apply to affected area once 30 g 3  . loratadine (CLARITIN) 10 MG tablet Take 10 mg by mouth daily.    Marland Kitchen LORazepam (ATIVAN) 0.5 MG tablet Take 1 tablet (0.5 mg total) by mouth at bedtime. 30 tablet 0  . Naproxen Sodium (ALEVE) 220 MG CAPS Take 1 capsule by mouth. As needed for pain    . ondansetron (ZOFRAN) 8 MG tablet Take 1 tablet (8 mg total) by mouth 2 (two)  times daily as needed. Start on the third day after chemotherapy. 30 tablet 1  . oxyCODONE (OXY IR/ROXICODONE) 5 MG immediate release tablet Take 1-2 tablets (5-10 mg total) by mouth every 6 (six) hours as needed for severe pain. 20 tablet 0  . prochlorperazine (COMPAZINE) 10 MG tablet Take 1 tablet (10 mg total) by mouth every 6 (six) hours as needed (Nausea or vomiting). 30 tablet 1  .  promethazine (PHENERGAN) 25 MG/ML injection Inject 0.25-0.5 mLs (6.25-12.5 mg total) into the vein every 15 (fifteen) minutes as needed for nausea. 1 mL 0  . valsartan-hydrochlorothiazide (DIOVAN-HCT) 320-25 MG tablet Take 1 tablet by mouth daily.     No current facility-administered medications for this visit.    Facility-Administered Medications Ordered in Other Visits  Medication Dose Route Frequency Provider Last Rate Last Dose  . sodium chloride flush (NS) 0.9 % injection 10 mL  10 mL Intravenous PRN Serena Croissant, MD   10 mL at 04/12/16 1512    PHYSICAL EXAMINATION: ECOG PERFORMANCE STATUS: 1 - Symptomatic but completely ambulatory  Vitals:   04/17/16 1248  BP: 105/69  Pulse: 81  Resp: 18  Temp: 98.3 F (36.8 C)   Filed Weights   04/17/16 1248  Weight: 288 lb 11.2 oz (131 kg)    GENERAL:alert, no distress and comfortable SKIN: skin color, texture, turgor are normal, no rashes or significant lesions EYES: normal, Conjunctiva are pink and non-injected, sclera clear OROPHARYNX:no exudate, no erythema and lips, buccal mucosa, and tongue normal  NECK: supple, thyroid normal size, non-tender, without nodularity LYMPH:  no palpable lymphadenopathy in the cervical, axillary or inguinal LUNGS: clear to auscultation and percussion with normal breathing effort HEART: regular rate & rhythm and no murmurs and no lower extremity edema ABDOMEN:abdomen soft, non-tender and normal bowel sounds MUSCULOSKELETAL:no cyanosis of digits and no clubbing  NEURO: alert & oriented x 3 with fluent speech, no focal motor/sensory deficits EXTREMITIES: No lower extremity edema  LABORATORY DATA:  I have reviewed the data as listed   Chemistry      Component Value Date/Time   NA 138 04/17/2016 1140   K 3.5 04/17/2016 1140   CO2 29 04/17/2016 1140   BUN 16.7 04/17/2016 1140   CREATININE 0.8 04/17/2016 1140      Component Value Date/Time   CALCIUM 9.5 04/17/2016 1140   ALKPHOS 114 04/17/2016  1140   AST 35 (H) 04/17/2016 1140   ALT 27 04/17/2016 1140   BILITOT 0.61 04/17/2016 1140       Lab Results  Component Value Date   WBC 4.5 04/17/2016   HGB 13.4 04/17/2016   HCT 41.0 04/17/2016   MCV 79.7 04/17/2016   PLT 183 04/17/2016   NEUTROABS 4.0 04/17/2016    ASSESSMENT & PLAN:  Malignant neoplasm of upper-outer quadrant of left breast in female, estrogen receptor negative ([facility]) Left lumpectomy 03/08/2016: IDC 2 cm, margins negative, 0/2 lymph nodes, grade 3, ER 0%, PR 0%, HER-2 negative ratio 1.31, Ki-67 40%, T1 CN 0 stage IA  Treatment plan: 1. Adjuvant chemotherapy with dose dense Adriamycin and Cytoxan 4 followed by weekly Taxol 12  started 04/17/2016 2. Followed by radiation therapy ----------------------------------------------------------------------------------------------------------------------------------------- Current treatment: Cycle 1 day 8 dose dense Adriamycin Cytoxan Echocardiogram 03/06/2016: EF 60-65% Closely monitoring for chemotherapy toxicities.  Chemotherapy toxicities: 1. Nausea grade 1 2. fatigue grade 1 Patient's blood work was reviewed and did not show any neutropenia. Patient would like to go back to work and I supported it.  Return  to clinic in one week for cycle 2 of treatment. Patient will only received 3 cycles of Adriamycin Cytoxan because of her prior Adriamycin exposure. Cardiology evaluation is pending  I spent 25 minutes talking to the patient of which more than half was spent in counseling and coordination of care.  No orders of the defined types were placed in this encounter.  The patient has a good understanding of the overall plan. she agrees with it. she will call with any problems that may develop before the next visit here.   Rulon Eisenmenger, MD 04/17/16

## 2016-04-23 ENCOUNTER — Other Ambulatory Visit: Payer: Self-pay

## 2016-04-23 MED ORDER — BENZONATATE 100 MG PO CAPS: 100.0000 mg | ORAL_CAPSULE | Freq: Two times a day (BID) | ORAL | 0 refills | Status: DC | PRN

## 2016-04-23 NOTE — Progress Notes (Signed)
Pt called states that she has had a dry cough for a week now and was told last week to take over the counter medications. Pt denies any cp or sob. Pt denies any fever or chills. Pt states that she is coming in tomorrow for her next chemo (Cycle 2 A/C). Pt states that she tried mucinex and robitussin but did not help and wanted to get something stronger for her cough symptoms. Obtained order for tessalon pearles from [facility].Will send script to pharmacy.

## 2016-04-24 ENCOUNTER — Ambulatory Visit (HOSPITAL_COMMUNITY)
Admission: RE | Admit: 2016-04-24 | Discharge: 2016-04-24 | Disposition: A | Discharge status: Home / Self Care | Payer: BLUE CROSS/BLUE SHIELD | Source: Ambulatory Visit | Attending: Hematology and Oncology | Admitting: Hematology and Oncology

## 2016-04-24 ENCOUNTER — Other Ambulatory Visit: Payer: BLUE CROSS/BLUE SHIELD

## 2016-04-24 ENCOUNTER — Ambulatory Visit (HOSPITAL_BASED_OUTPATIENT_CLINIC_OR_DEPARTMENT_OTHER): Payer: BLUE CROSS/BLUE SHIELD

## 2016-04-24 ENCOUNTER — Ambulatory Visit (HOSPITAL_BASED_OUTPATIENT_CLINIC_OR_DEPARTMENT_OTHER): Payer: BLUE CROSS/BLUE SHIELD | Admitting: Hematology and Oncology

## 2016-04-24 ENCOUNTER — Ambulatory Visit: Payer: BLUE CROSS/BLUE SHIELD

## 2016-04-24 ENCOUNTER — Encounter: Payer: Self-pay | Admitting: Hematology and Oncology

## 2016-04-24 ENCOUNTER — Encounter: Payer: Self-pay | Admitting: *Deleted

## 2016-04-24 ENCOUNTER — Other Ambulatory Visit: Payer: Self-pay

## 2016-04-24 DIAGNOSIS — R05 Cough: Secondary | ICD-10-CM | POA: Insufficient documentation

## 2016-04-24 DIAGNOSIS — C50412 Malignant neoplasm of upper-outer quadrant of left female breast: Secondary | ICD-10-CM

## 2016-04-24 DIAGNOSIS — R0602 Shortness of breath: Secondary | ICD-10-CM | POA: Diagnosis present

## 2016-04-24 DIAGNOSIS — J9801 Acute bronchospasm: Secondary | ICD-10-CM

## 2016-04-24 DIAGNOSIS — Z171 Estrogen receptor negative status [ER-]: Secondary | ICD-10-CM

## 2016-04-24 DIAGNOSIS — Z5111 Encounter for antineoplastic chemotherapy: Secondary | ICD-10-CM

## 2016-04-24 DIAGNOSIS — Z170 Estrogen receptor positive status [ER+]: Secondary | ICD-10-CM | POA: Insufficient documentation

## 2016-04-24 DIAGNOSIS — R110 Nausea: Secondary | ICD-10-CM

## 2016-04-24 DIAGNOSIS — R5383 Other fatigue: Secondary | ICD-10-CM

## 2016-04-24 DIAGNOSIS — J9811 Atelectasis: Secondary | ICD-10-CM | POA: Insufficient documentation

## 2016-04-24 LAB — CBC WITH DIFFERENTIAL/PLATELET
BASO%: 0.9 % (ref 0.0–2.0)
BASOS ABS: 0 10*3/uL (ref 0.0–0.1)
EOS ABS: 0 10*3/uL (ref 0.0–0.5)
EOS%: 0.7 % (ref 0.0–7.0)
HEMATOCRIT: 36.5 % (ref 34.8–46.6)
HEMOGLOBIN: 12.1 g/dL (ref 11.6–15.9)
LYMPH#: 0.3 10*3/uL — AB (ref 0.9–3.3)
LYMPH%: 10.5 % — ABNORMAL LOW (ref 14.0–49.7)
MCH: 26.2 pg (ref 25.1–34.0)
MCHC: 33.3 g/dL (ref 31.5–36.0)
MCV: 78.9 fL — ABNORMAL LOW (ref 79.5–101.0)
MONO#: 0.3 10*3/uL (ref 0.1–0.9)
MONO%: 10.2 % (ref 0.0–14.0)
NEUT#: 2.5 10*3/uL (ref 1.5–6.5)
NEUT%: 77.7 % — ABNORMAL HIGH (ref 38.4–76.8)
Platelets: 269 10*3/uL (ref 145–400)
RBC: 4.63 10*6/uL (ref 3.70–5.45)
RDW: 15.3 % — AB (ref 11.2–14.5)
WBC: 3.2 10*3/uL — ABNORMAL LOW (ref 3.9–10.3)

## 2016-04-24 LAB — COMPREHENSIVE METABOLIC PANEL
ALBUMIN: 3.2 g/dL — AB (ref 3.5–5.0)
ALK PHOS: 95 U/L (ref 40–150)
ALT: 17 U/L (ref 0–55)
AST: 29 U/L (ref 5–34)
Anion Gap: 12 mEq/L — ABNORMAL HIGH (ref 3–11)
BILIRUBIN TOTAL: 0.22 mg/dL (ref 0.20–1.20)
BUN: 8.3 mg/dL (ref 7.0–26.0)
CALCIUM: 9.4 mg/dL (ref 8.4–10.4)
CO2: 28 mEq/L (ref 22–29)
CREATININE: 1 mg/dL (ref 0.6–1.1)
Chloride: 101 mEq/L (ref 98–109)
EGFR: 81 mL/min/{1.73_m2} — ABNORMAL LOW (ref >90)
Glucose: 133 mg/dl (ref 70–140)
POTASSIUM: 3.1 meq/L — AB (ref 3.5–5.1)
Sodium: 140 mEq/L (ref 136–145)
Total Protein: 7.2 g/dL (ref 6.4–8.3)

## 2016-04-24 MED ORDER — ALBUTEROL SULFATE HFA 108 (90 BASE) MCG/ACT IN AERS: 2.0000 | INHALATION_SPRAY | Freq: Four times a day (QID) | RESPIRATORY_TRACT | 2 refills | Status: DC | PRN

## 2016-04-24 MED ORDER — SODIUM CHLORIDE 0.9 % IV SOLN
Freq: Once | INTRAVENOUS | Status: AC
  Administered 2016-04-24: 12:00:00 via INTRAVENOUS

## 2016-04-24 MED ORDER — SODIUM CHLORIDE 0.9 % IJ SOLN
10.0000 mL | Freq: Once | INTRAMUSCULAR | Status: AC
  Administered 2016-04-24: 10 mL
  Filled 2016-04-24: qty 10

## 2016-04-24 MED ORDER — PALONOSETRON HCL INJECTION 0.25 MG/5ML
INTRAVENOUS | Status: AC
  Filled 2016-04-24: qty 5

## 2016-04-24 MED ORDER — SODIUM CHLORIDE 0.9% FLUSH
10.0000 mL | INTRAVENOUS | Status: DC | PRN
  Administered 2016-04-24: 10 mL
  Filled 2016-04-24: qty 10

## 2016-04-24 MED ORDER — FOSAPREPITANT DIMEGLUMINE INJECTION 150 MG
Freq: Once | INTRAVENOUS | Status: AC
  Administered 2016-04-24: 13:00:00 via INTRAVENOUS
  Filled 2016-04-24: qty 5

## 2016-04-24 MED ORDER — DOXORUBICIN HCL CHEMO IV INJECTION 2 MG/ML
60.0000 mg/m2 | Freq: Once | INTRAVENOUS | Status: AC
  Administered 2016-04-24: 154 mg via INTRAVENOUS
  Filled 2016-04-24: qty 77

## 2016-04-24 MED ORDER — PEGFILGRASTIM 6 MG/0.6ML ~~LOC~~ PSKT
6.0000 mg | PREFILLED_SYRINGE | Freq: Once | SUBCUTANEOUS | Status: AC
  Administered 2016-04-24: 6 mg via SUBCUTANEOUS
  Filled 2016-04-24: qty 0.6

## 2016-04-24 MED ORDER — SODIUM CHLORIDE 0.9 % IV SOLN
600.0000 mg/m2 | Freq: Once | INTRAVENOUS | Status: AC
  Administered 2016-04-24: 1540 mg via INTRAVENOUS
  Filled 2016-04-24: qty 77

## 2016-04-24 MED ORDER — PALONOSETRON HCL INJECTION 0.25 MG/5ML
0.2500 mg | Freq: Once | INTRAVENOUS | Status: AC
  Administered 2016-04-24: 0.25 mg via INTRAVENOUS

## 2016-04-24 MED ORDER — ALBUTEROL SULFATE (2.5 MG/3ML) 0.083% IN NEBU
2.5000 mg | INHALATION_SOLUTION | Freq: Once | RESPIRATORY_TRACT | Status: AC
  Administered 2016-04-24: 2.5 mg via RESPIRATORY_TRACT
  Filled 2016-04-24: qty 3

## 2016-04-24 MED ORDER — HEPARIN SOD (PORK) LOCK FLUSH 100 UNIT/ML IV SOLN
500.0000 [IU] | Freq: Once | INTRAVENOUS | Status: AC | PRN
  Administered 2016-04-24: 500 [IU]
  Filled 2016-04-24: qty 5

## 2016-04-24 NOTE — Progress Notes (Signed)
Per May RN, per Dr. Blair Dolphin to treat today after patient goes to radiology for CXR and she receives an albuterol nebulizer treatment in the infusion room.

## 2016-04-24 NOTE — Progress Notes (Signed)
Patient Care Team: [name], DO as PCP - General (Family Medicine) [name], MD as Consulting Physician (General Surgery) [name], MD as Consulting Physician (Hematology and Oncology) [name], MD as Consulting Physician (Radiation Oncology) Gardenia Phlegm, NP as Nurse Practitioner (Hematology and Oncology)  DIAGNOSIS:  Encounter Diagnosis  Name Primary?  . Malignant neoplasm of upper-outer quadrant of left breast in female, estrogen receptor negative ([place])     SUMMARY OF ONCOLOGIC HISTORY:   Malignant neoplasm of upper-outer quadrant of left breast in female, estrogen receptor negative ([place])   03/20/1995 Initial Biopsy    Head and Neck Cancer      02/09/2016 Initial Diagnosis    Left breast biopsy 2:00: IDC, left axillary lymph node biopsy negative, grade 3, ER 0%, PR 0%, Ki-67 40%, HER-2 negative ratio 1.31, screening detected left breast mass 1.8 cm, left axillary lymph node 5 mm benign, T1 CN 0 stage IA clinical stage      03/13/2016 Surgery    Left lumpectomy: IDC 2 cm, margins negative, 0/2 lymph nodes, grade 3, ER 0%, PR 0%, HER-2 negative ratio 1.31, Ki-67 40%, T1 CN 0 stage IA      04/10/2016 -  Chemotherapy    Adjuvant chemotherapy with dose dense Adriamycin and Cytoxan followed by Taxol weekly 12      CHIEF COMPLIANT:   Cycle 2 dose dense Adriamycin and Cytoxan  INTERVAL HISTORY: Carla Pierce is a 19 year with above-mentioned history left breast cancer treated with lumpectomy and is currently on adjuvant chemotherapy with dose dense Adriamycin Cytoxan. Today is cycle 2. she tolerated chemotherapy fairly well. But since the past one week she has had cough with wheezing and difficulty with breathing. She denies any fevers or chills. She suspects it is related to pollen. She has been coughing profusely.  REVIEW OF SYSTEMS:   Constitutional: Denies fevers, chills or abnormal weight loss Eyes: Denies blurriness of vision Ears, nose,  mouth, throat, and face: Denies mucositis or sore throat Respiratory: Profound cough and shortness of breath to exertion and wheezing Cardiovascular: Denies palpitation, chest discomfort Gastrointestinal:  Denies nausea, heartburn or change in bowel habits Skin: Denies abnormal skin rashes Lymphatics: Denies new lymphadenopathy or easy bruising Neurological:Denies numbness, tingling or new weaknesses Behavioral/Psych: Mood is stable, no new changes  Extremities: No lower extremity edema Breast:  denies any pain or lumps or nodules in either breasts All other systems were reviewed with the patient and are negative.  I have reviewed the past medical history, past surgical history, social history and family history with the patient and they are unchanged from previous note.  ALLERGIES:  has No Known Allergies.  MEDICATIONS:  Current Outpatient Prescriptions  Medication Sig Dispense Refill  . AMLODIPINE BESYLATE PO Take 10 mg by mouth.    . benzonatate (TESSALON) 100 MG capsule Take 1 capsule (100 mg total) by mouth 2 (two) times daily as needed for cough. 20 capsule 0  . cholecalciferol (VITAMIN D) 1000 units tablet Take 1,000 Units by mouth daily.    . fluticasone (FLONASE) 50 MCG/ACT nasal spray Place into both nostrils daily.    Marland Kitchen levothyroxine (SYNTHROID, LEVOTHROID) 200 MCG tablet Take 200 mcg by mouth daily before breakfast.    . levothyroxine (SYNTHROID, LEVOTHROID) 50 MCG tablet Take 50 mcg by mouth daily before breakfast.    . lidocaine-prilocaine (EMLA) cream Apply to affected area once 30 g 3  . loratadine (CLARITIN) 10 MG tablet Take 10 mg by mouth daily.    Marland Kitchen  LORazepam (ATIVAN) 0.5 MG tablet Take 1 tablet (0.5 mg total) by mouth at bedtime. 30 tablet 0  . Naproxen Sodium (ALEVE) 220 MG CAPS Take 1 capsule by mouth. As needed for pain    . ondansetron (ZOFRAN) 8 MG tablet Take 1 tablet (8 mg total) by mouth 2 (two) times daily as needed. Start on the third day after  chemotherapy. 30 tablet 1  . oxyCODONE (OXY IR/ROXICODONE) 5 MG immediate release tablet Take 1-2 tablets (5-10 mg total) by mouth every 6 (six) hours as needed for severe pain. 20 tablet 0  . prochlorperazine (COMPAZINE) 10 MG tablet Take 1 tablet (10 mg total) by mouth every 6 (six) hours as needed (Nausea or vomiting). 30 tablet 1  . promethazine (PHENERGAN) 25 MG/ML injection Inject 0.25-0.5 mLs (6.25-12.5 mg total) into the vein every 15 (fifteen) minutes as needed for nausea. 1 mL 0  . valsartan-hydrochlorothiazide (DIOVAN-HCT) 320-25 MG tablet Take 1 tablet by mouth daily.     No current facility-administered medications for this visit.    Facility-Administered Medications Ordered in Other Visits  Medication Dose Route Frequency Provider Last Rate Last Dose  . sodium chloride flush (NS) 0.9 % injection 10 mL  10 mL Intravenous PRN [name], MD   10 mL at 04/12/16 1512    PHYSICAL EXAMINATION: ECOG PERFORMANCE STATUS: 1 - Symptomatic but completely ambulatory  Vitals:   04/24/16 1040  BP: 122/73  Pulse: 93  Resp: 18  Temp: 97.4 F (36.3 C)   Filed Weights   04/24/16 1040  Weight: 286 lb 3.2 oz (129.8 kg)    GENERAL:alert, no distress and comfortable SKIN: skin color, texture, turgor are normal, no rashes or significant lesions EYES: normal, Conjunctiva are pink and non-injected, sclera clear OROPHARYNX:no exudate, no erythema and lips, buccal mucosa, and tongue normal  NECK: supple, thyroid normal size, non-tender, without nodularity LYMPH:  no palpable lymphadenopathy in the cervical, axillary or inguinal LUNGS: Bilateral diffuse wheezing. No crackles HEART: regular rate & rhythm and no murmurs and no lower extremity edema ABDOMEN:abdomen soft, non-tender and normal bowel sounds MUSCULOSKELETAL:no cyanosis of digits and no clubbing  NEURO: alert & oriented x 3 with fluent speech, no focal motor/sensory deficits EXTREMITIES: No lower extremity edema  LABORATORY  DATA:  I have reviewed the data as listed   Chemistry      Component Value Date/Time   NA 138 04/17/2016 1140   K 3.5 04/17/2016 1140   CO2 29 04/17/2016 1140   BUN 16.7 04/17/2016 1140   CREATININE 0.8 04/17/2016 1140      Component Value Date/Time   CALCIUM 9.5 04/17/2016 1140   ALKPHOS 114 04/17/2016 1140   AST 35 (H) 04/17/2016 1140   ALT 27 04/17/2016 1140   BILITOT 0.61 04/17/2016 1140       Lab Results  Component Value Date   WBC 3.2 (L) 04/24/2016   HGB 12.1 04/24/2016   HCT 36.5 04/24/2016   MCV 78.9 (L) 04/24/2016   PLT 269 04/24/2016   NEUTROABS 2.5 04/24/2016    ASSESSMENT & PLAN:  Malignant neoplasm of upper-outer quadrant of left breast in female, estrogen receptor negative ([place]) Left lumpectomy 03/08/2016: IDC 2 cm, margins negative, 0/2 lymph nodes, grade 3, ER 0%, PR 0%, HER-2 negative ratio 1.31, Ki-67 40%, T1 CN 0 stage IA  Treatment plan: 1. Adjuvant chemotherapy with dose dense Adriamycin and Cytoxan 4 followed by weekly Taxol 12  started 04/17/2016 2. Followed by radiation therapy ----------------------------------------------------------------------------------------------------------------------------------------- Current treatment: Cycle  2 day 1 dose dense Adriamycin Cytoxan Echocardiogram 03/06/2016: EF 60-65% Closely monitoring for chemotherapy toxicities.  Chemotherapy toxicities: 1. Nausea grade 1 2. fatigue grade 1 Patient's blood work was reviewed.  Bronchospasm: Patient has profound wheezing and cough with expectoration: We'll get a chest x-ray today. I will also give her albuterol and Atrovent inhalation today. She also gets dexamethasone through chemotherapy and that should help with bronchospasm. If the x-ray shows any abnormalities and we may have to give her antibiotics but otherwise we can treat her symptomatically. I sent her a prescription for Proventil.  Return to clinic in 2 weeks for cycle 3 of treatment. Patient will  only received 3 cycles of Adriamycin Cytoxan because of her prior Adriamycin exposure. Cardiology evaluation    I spent 25 minutes talking to the patient of which more than half was spent in counseling and coordination of care.  No orders of the defined types were placed in this encounter.  The patient has a good understanding of the overall plan. she agrees with it. she will call with any problems that may develop before the next visit here.   Rulon Eisenmenger, MD 04/24/16

## 2016-04-24 NOTE — Assessment & Plan Note (Signed)
Left lumpectomy 03/08/2016: IDC 2 cm, margins negative, 0/2 lymph nodes, grade 3, ER 0%, PR 0%, HER-2 negative ratio 1.31, Ki-67 40%, T1 CN 0 stage IA  Treatment plan: 1. Adjuvant chemotherapy with dose dense Adriamycin and Cytoxan 4 followed by weekly Taxol 12  started 04/17/2016 2. Followed by radiation therapy ----------------------------------------------------------------------------------------------------------------------------------------- Current treatment: Cycle 2 day 1 dose dense Adriamycin Cytoxan Echocardiogram 03/06/2016: EF 60-65% Closely monitoring for chemotherapy toxicities.  Chemotherapy toxicities: 1. Nausea grade 1 2. fatigue grade 1 Patient's blood work was reviewed. Patient would like to go back to work and I supported it.  Return to clinic in 2 weeks for cycle 3 of treatment. Patient will only received 3 cycles of Adriamycin Cytoxan because of her prior Adriamycin exposure. Cardiology evaluation

## 2016-04-24 NOTE — Patient Instructions (Signed)
[facility] Discharge Instructions for Patients Receiving Chemotherapy  Today you received the following chemotherapy agents Adriamycin and Cytoxan   To help prevent nausea and vomiting after your treatment, we encourage you to take your nausea medication as directed. No Zofran for 3 days. Take Compazine instead.    If you develop nausea and vomiting that is not controlled by your nausea medication, call the clinic.   BELOW ARE SYMPTOMS THAT SHOULD BE REPORTED IMMEDIATELY:  *FEVER GREATER THAN 100.5 F  *CHILLS WITH OR WITHOUT FEVER  NAUSEA AND VOMITING THAT IS NOT CONTROLLED WITH YOUR NAUSEA MEDICATION  *UNUSUAL SHORTNESS OF BREATH  *UNUSUAL BRUISING OR BLEEDING  TENDERNESS IN MOUTH AND THROAT WITH OR WITHOUT PRESENCE OF ULCERS  *URINARY PROBLEMS  *BOWEL PROBLEMS  UNUSUAL RASH Items with * indicate a potential emergency and should be followed up as soon as possible.  Feel free to call the clinic you have any questions or concerns. The clinic phone number is [phone].  Please show the CHEMO ALERT CARD at check-in to the Emergency Department and triage nurse.   

## 2016-04-25 ENCOUNTER — Encounter: Payer: BLUE CROSS/BLUE SHIELD | Admitting: Genetic Counselor

## 2016-04-25 ENCOUNTER — Other Ambulatory Visit: Payer: BLUE CROSS/BLUE SHIELD

## 2016-05-01 ENCOUNTER — Ambulatory Visit: Payer: BLUE CROSS/BLUE SHIELD | Admitting: Hematology and Oncology

## 2016-05-01 ENCOUNTER — Other Ambulatory Visit: Payer: BLUE CROSS/BLUE SHIELD

## 2016-05-08 ENCOUNTER — Ambulatory Visit (HOSPITAL_BASED_OUTPATIENT_CLINIC_OR_DEPARTMENT_OTHER): Payer: BLUE CROSS/BLUE SHIELD | Admitting: Hematology and Oncology

## 2016-05-08 ENCOUNTER — Ambulatory Visit (HOSPITAL_BASED_OUTPATIENT_CLINIC_OR_DEPARTMENT_OTHER): Payer: BLUE CROSS/BLUE SHIELD

## 2016-05-08 ENCOUNTER — Ambulatory Visit: Payer: BLUE CROSS/BLUE SHIELD

## 2016-05-08 ENCOUNTER — Encounter: Payer: Self-pay | Admitting: Hematology and Oncology

## 2016-05-08 ENCOUNTER — Other Ambulatory Visit (HOSPITAL_BASED_OUTPATIENT_CLINIC_OR_DEPARTMENT_OTHER): Payer: BLUE CROSS/BLUE SHIELD

## 2016-05-08 DIAGNOSIS — Z95828 Presence of other vascular implants and grafts: Secondary | ICD-10-CM

## 2016-05-08 DIAGNOSIS — C50412 Malignant neoplasm of upper-outer quadrant of left female breast: Secondary | ICD-10-CM

## 2016-05-08 DIAGNOSIS — Z5111 Encounter for antineoplastic chemotherapy: Secondary | ICD-10-CM | POA: Diagnosis not present

## 2016-05-08 DIAGNOSIS — Z171 Estrogen receptor negative status [ER-]: Secondary | ICD-10-CM

## 2016-05-08 DIAGNOSIS — D6481 Anemia due to antineoplastic chemotherapy: Secondary | ICD-10-CM

## 2016-05-08 LAB — CBC WITH DIFFERENTIAL/PLATELET
BASO%: 0.5 % (ref 0.0–2.0)
BASOS ABS: 0 10*3/uL (ref 0.0–0.1)
EOS ABS: 0 10*3/uL (ref 0.0–0.5)
EOS%: 0.4 % (ref 0.0–7.0)
HEMATOCRIT: 32.2 % — AB (ref 34.8–46.6)
HEMOGLOBIN: 10.6 g/dL — AB (ref 11.6–15.9)
LYMPH#: 0.4 10*3/uL — AB (ref 0.9–3.3)
LYMPH%: 4.4 % — ABNORMAL LOW (ref 14.0–49.7)
MCH: 26.1 pg (ref 25.1–34.0)
MCHC: 33 g/dL (ref 31.5–36.0)
MCV: 79.2 fL — ABNORMAL LOW (ref 79.5–101.0)
MONO#: 0.7 10*3/uL (ref 0.1–0.9)
MONO%: 8.5 % (ref 0.0–14.0)
NEUT%: 86.2 % — ABNORMAL HIGH (ref 38.4–76.8)
NEUTROS ABS: 7.4 10*3/uL — AB (ref 1.5–6.5)
Platelets: 247 10*3/uL (ref 145–400)
RBC: 4.06 10*6/uL (ref 3.70–5.45)
RDW: 15.5 % — AB (ref 11.2–14.5)
WBC: 8.6 10*3/uL (ref 3.9–10.3)

## 2016-05-08 LAB — COMPREHENSIVE METABOLIC PANEL
ALBUMIN: 3.4 g/dL — AB (ref 3.5–5.0)
ALT: 20 U/L (ref 0–55)
AST: 31 U/L (ref 5–34)
Alkaline Phosphatase: 97 U/L (ref 40–150)
Anion Gap: 10 mEq/L (ref 3–11)
BUN: 18 mg/dL (ref 7.0–26.0)
CALCIUM: 9.2 mg/dL (ref 8.4–10.4)
CO2: 28 mEq/L (ref 22–29)
CREATININE: 0.8 mg/dL (ref 0.6–1.1)
Chloride: 103 mEq/L (ref 98–109)
EGFR: >90 mL/min/{1.73_m2} (ref >90)
Glucose: 98 mg/dl (ref 70–140)
Potassium: 3.1 mEq/L — ABNORMAL LOW (ref 3.5–5.1)
Sodium: 141 mEq/L (ref 136–145)
TOTAL PROTEIN: 6.9 g/dL (ref 6.4–8.3)
Total Bilirubin: <0.22 mg/dL (ref 0.20–1.20)

## 2016-05-08 MED ORDER — PALONOSETRON HCL INJECTION 0.25 MG/5ML
INTRAVENOUS | Status: AC
Start: 2016-05-08 — End: ?
  Filled 2016-05-08: qty 5

## 2016-05-08 MED ORDER — POTASSIUM CHLORIDE ER 10 MEQ PO CPCR: 10.0000 meq | ORAL_CAPSULE | Freq: Two times a day (BID) | ORAL | 1 refills | Status: DC

## 2016-05-08 MED ORDER — PALONOSETRON HCL INJECTION 0.25 MG/5ML
0.2500 mg | Freq: Once | INTRAVENOUS | Status: AC
Start: 2016-05-08 — End: 2016-05-08
  Administered 2016-05-08: 0.25 mg via INTRAVENOUS

## 2016-05-08 MED ORDER — DOXORUBICIN HCL CHEMO IV INJECTION 2 MG/ML
60.0000 mg/m2 | Freq: Once | INTRAVENOUS | Status: AC
  Administered 2016-05-08: 154 mg via INTRAVENOUS
  Filled 2016-05-08: qty 77

## 2016-05-08 MED ORDER — SODIUM CHLORIDE 0.9 % IV SOLN
Freq: Once | INTRAVENOUS | Status: AC
  Administered 2016-05-08: 15:00:00 via INTRAVENOUS

## 2016-05-08 MED ORDER — SODIUM CHLORIDE 0.9% FLUSH
10.0000 mL | INTRAVENOUS | Status: DC | PRN
  Administered 2016-05-08: 10 mL
  Filled 2016-05-08: qty 10

## 2016-05-08 MED ORDER — SODIUM CHLORIDE 0.9% FLUSH
10.0000 mL | Freq: Once | INTRAVENOUS | Status: AC
  Administered 2016-05-08: 10 mL via INTRAVENOUS
  Filled 2016-05-08: qty 10

## 2016-05-08 MED ORDER — SODIUM CHLORIDE 0.9 % IV SOLN
600.0000 mg/m2 | Freq: Once | INTRAVENOUS | Status: AC
  Administered 2016-05-08: 1540 mg via INTRAVENOUS
  Filled 2016-05-08: qty 77

## 2016-05-08 MED ORDER — SODIUM CHLORIDE 0.9 % IV SOLN
Freq: Once | INTRAVENOUS | Status: AC
  Administered 2016-05-08: 15:00:00 via INTRAVENOUS
  Filled 2016-05-08: qty 5

## 2016-05-08 MED ORDER — HEPARIN SOD (PORK) LOCK FLUSH 100 UNIT/ML IV SOLN
500.0000 [IU] | Freq: Once | INTRAVENOUS | Status: AC | PRN
  Administered 2016-05-08: 500 [IU]
  Filled 2016-05-08: qty 5

## 2016-05-08 MED ORDER — PEGFILGRASTIM 6 MG/0.6ML ~~LOC~~ PSKT
6.0000 mg | PREFILLED_SYRINGE | Freq: Once | SUBCUTANEOUS | Status: AC
  Administered 2016-05-08: 6 mg via SUBCUTANEOUS
  Filled 2016-05-08: qty 0.6

## 2016-05-08 NOTE — Progress Notes (Signed)
Blood return noted before, every 3 mls and after Adriamycin push.  

## 2016-05-08 NOTE — Assessment & Plan Note (Signed)
Left lumpectomy 03/08/2016: IDC 2 cm, margins negative, 0/2 lymph nodes, grade 3, ER 0%, PR 0%, HER-2 negative ratio 1.31, Ki-67 40%, T1 CN 0 stage IA  Treatment plan: 1. Adjuvant chemotherapy with dose dense Adriamycin and Cytoxan 4 followed by weekly Taxol 12 started 04/17/2016 2. Followed by radiation therapy ----------------------------------------------------------------------------------------------------------------------------------------- Current treatment: Cycle 3 day 1dose dense Adriamycin Cytoxan Echocardiogram 03/06/2016: EF 60-65% Closely monitoring for chemotherapy toxicities.  Chemotherapy toxicities: 1. Nausea grade 1 2. fatigue grade 1 Patient's blood work was reviewed.  Bronchospasm: Patient has profound wheezing and cough with expectoration: Chest x-ray was clear. We gave her albuterol and Atrovent inhalation.   Return to clinic in 2 weeks for cycle 1 of Taxol. Patient will only receive 3 cycles of Adriamycin Cytoxan because of her prior Adriamycin exposure. Cardiology evaluation

## 2016-05-08 NOTE — Patient Instructions (Signed)
[facility] Discharge Instructions for Patients Receiving Chemotherapy  Today you received the following chemotherapy agents:Adriamycin and Cytoxan   To help prevent nausea and vomiting after your treatment, we encourage you to take your nausea medication as directed.    If you develop nausea and vomiting that is not controlled by your nausea medication, call the clinic.   BELOW ARE SYMPTOMS THAT SHOULD BE REPORTED IMMEDIATELY:  *FEVER GREATER THAN 100.5 F  *CHILLS WITH OR WITHOUT FEVER  NAUSEA AND VOMITING THAT IS NOT CONTROLLED WITH YOUR NAUSEA MEDICATION  *UNUSUAL SHORTNESS OF BREATH  *UNUSUAL BRUISING OR BLEEDING  TENDERNESS IN MOUTH AND THROAT WITH OR WITHOUT PRESENCE OF ULCERS  *URINARY PROBLEMS  *BOWEL PROBLEMS  UNUSUAL RASH Items with * indicate a potential emergency and should be followed up as soon as possible.  Feel free to call the clinic you have any questions or concerns. The clinic phone number is [phone].  Please show the CHEMO ALERT CARD at check-in to the Emergency Department and triage nurse.   

## 2016-05-08 NOTE — Patient Instructions (Signed)
Implanted Port Home Guide An implanted port is a type of central line that is placed under the skin. Central lines are used to provide IV access when treatment or nutrition needs to be given through a person's veins. Implanted ports are used for long-term IV access. An implanted port may be placed because:  You need IV medicine that would be irritating to the small veins in your hands or arms.  You need long-term IV medicines, such as antibiotics.  You need IV nutrition for a long period.  You need frequent blood draws for lab tests.  You need dialysis.  Implanted ports are usually placed in the chest area, but they can also be placed in the upper arm, the abdomen, or the leg. An implanted port has two main parts:  Reservoir. The reservoir is round and will appear as a small, raised area under your skin. The reservoir is the part where a needle is inserted to give medicines or draw blood.  Catheter. The catheter is a thin, flexible tube that extends from the reservoir. The catheter is placed into a large vein. Medicine that is inserted into the reservoir goes into the catheter and then into the vein.  How will I care for my incision site? Do not get the incision site wet. Bathe or shower as directed by your health care provider. How is my port accessed? Special steps must be taken to access the port:  Before the port is accessed, a numbing cream can be placed on the skin. This helps numb the skin over the port site.  Your health care provider uses a sterile technique to access the port. ? Your health care provider must put on a mask and sterile gloves. ? The skin over your port is cleaned carefully with an antiseptic and allowed to dry. ? The port is gently pinched between sterile gloves, and a needle is inserted into the port.  Only "non-coring" port needles should be used to access the port. Once the port is accessed, a blood return should be checked. This helps ensure that the port  is in the vein and is not clogged.  If your port needs to remain accessed for a constant infusion, a clear (transparent) bandage will be placed over the needle site. The bandage and needle will need to be changed every week, or as directed by your health care provider.  Keep the bandage covering the needle clean and dry. Do not get it wet. Follow your health care provider's instructions on how to take a shower or bath while the port is accessed.  If your port does not need to stay accessed, no bandage is needed over the port.  What is flushing? Flushing helps keep the port from getting clogged. Follow your health care provider's instructions on how and when to flush the port. Ports are usually flushed with saline solution or a medicine called heparin. The need for flushing will depend on how the port is used.  If the port is used for intermittent medicines or blood draws, the port will need to be flushed: ? After medicines have been given. ? After blood has been drawn. ? As part of routine maintenance.  If a constant infusion is running, the port may not need to be flushed.  How long will my port stay implanted? The port can stay in for as long as your health care provider thinks it is needed. When it is time for the port to come out, surgery will be   done to remove it. The procedure is similar to the one performed when the port was put in. When should I seek immediate medical care? When you have an implanted port, you should seek immediate medical care if:  You notice a bad smell coming from the incision site.  You have swelling, redness, or drainage at the incision site.  You have more swelling or pain at the port site or the surrounding area.  You have a fever that is not controlled with medicine.  This information is not intended to replace advice given to you by your health care provider. Make sure you discuss any questions you have with your health care provider. Document  Released: 12/18/2004 Document Revised: 05/26/2015 Document Reviewed: 08/25/2012 Elsevier Interactive Patient Education  2017 [employer]  

## 2016-05-08 NOTE — Progress Notes (Signed)
Patient Care Team: [name], DO as PCP - General (Family Medicine) [name], MD as Consulting Physician (General Surgery) [name], MD as Consulting Physician (Hematology and Oncology) [name], MD as Consulting Physician (Radiation Oncology) [name], NP as Nurse Practitioner (Hematology and Oncology)  DIAGNOSIS:  Encounter Diagnosis  Name Primary?  . Malignant neoplasm of upper-outer quadrant of left breast in female, estrogen receptor negative ([place])     SUMMARY OF ONCOLOGIC HISTORY:   Malignant neoplasm of upper-outer quadrant of left breast in female, estrogen receptor negative ([place])   03/20/1995 Initial Biopsy    Head and Neck Cancer      02/09/2016 Initial Diagnosis    Left breast biopsy 2:00: IDC, left axillary lymph node biopsy negative, grade 3, ER 0%, PR 0%, Ki-67 40%, HER-2 negative ratio 1.31, screening detected left breast mass 1.8 cm, left axillary lymph node 5 mm benign, T1 CN 0 stage IA clinical stage      03/13/2016 Surgery    Left lumpectomy: IDC 2 cm, margins negative, 0/2 lymph nodes, grade 3, ER 0%, PR 0%, HER-2 negative ratio 1.31, Ki-67 40%, T1 CN 0 stage IA      04/10/2016 -  Chemotherapy    Adjuvant chemotherapy with dose dense Adriamycin and Cytoxan followed by Taxol weekly 12       CHIEF COMPLIANT: cycle 3 of dose dense Adriamycin Cytoxan  INTERVAL HISTORY: Carla Pierce is a 46 year old with above-mentioned history of left breast cancer who is currently receiving adjuvant chemotherapy and today's cycle 3 of dose dense Adriamycin Cytoxan. She does for the last cycle she tolerated it very well. She did not have any nausea vomiting. She had fatigue for a few days but then she recovered.she had alopecia.  REVIEW OF SYSTEMS:   Constitutional: Denies fevers, chills or abnormal weight loss Eyes: Denies blurriness of vision Ears, nose, mouth, throat, and face: Denies mucositis or sore throat Respiratory: Denies  cough, dyspnea or wheezes Cardiovascular: Denies palpitation, chest discomfort Gastrointestinal:  Denies nausea, heartburn or change in bowel habits Skin: Denies abnormal skin rashes Lymphatics: Denies new lymphadenopathy or easy bruising Neurological:Denies numbness, tingling or new weaknesses Behavioral/Psych: Mood is stable, no new changes  Extremities: No lower extremity edema Breast:  denies any pain or lumps or nodules in either breasts All other systems were reviewed with the patient and are negative.  I have reviewed the past medical history, past surgical history, social history and family history with the patient and they are unchanged from previous note.  ALLERGIES:  has No Known Allergies.  MEDICATIONS:  Current Outpatient Prescriptions  Medication Sig Dispense Refill  . albuterol (PROVENTIL HFA;VENTOLIN HFA) 108 (90 Base) MCG/ACT inhaler Inhale 2 puffs into the lungs every 6 (six) hours as needed for wheezing or shortness of breath. 1 Inhaler 2  . AMLODIPINE BESYLATE PO Take 10 mg by mouth.    . benzonatate (TESSALON) 100 MG capsule Take 1 capsule (100 mg total) by mouth 2 (two) times daily as needed for cough. 20 capsule 0  . cholecalciferol (VITAMIN D) 1000 units tablet Take 1,000 Units by mouth daily.    . fluticasone (FLONASE) 50 MCG/ACT nasal spray Place into both nostrils daily.    Marland Kitchen levothyroxine (SYNTHROID, LEVOTHROID) 200 MCG tablet Take 200 mcg by mouth daily before breakfast.    . levothyroxine (SYNTHROID, LEVOTHROID) 50 MCG tablet Take 50 mcg by mouth daily before breakfast.    . lidocaine-prilocaine (EMLA) cream Apply to affected area once 30  g 3  . loratadine (CLARITIN) 10 MG tablet Take 10 mg by mouth daily.    Marland Kitchen LORazepam (ATIVAN) 0.5 MG tablet Take 1 tablet (0.5 mg total) by mouth at bedtime. 30 tablet 0  . Naproxen Sodium (ALEVE) 220 MG CAPS Take 1 capsule by mouth. As needed for pain    . ondansetron (ZOFRAN) 8 MG tablet Take 1 tablet (8 mg total) by mouth  2 (two) times daily as needed. Start on the third day after chemotherapy. 30 tablet 1  . oxyCODONE (OXY IR/ROXICODONE) 5 MG immediate release tablet Take 1-2 tablets (5-10 mg total) by mouth every 6 (six) hours as needed for severe pain. 20 tablet 0  . potassium chloride (MICRO-K) 10 MEQ CR capsule Take 1 capsule (10 mEq total) by mouth 2 (two) times daily. 30 capsule 1  . prochlorperazine (COMPAZINE) 10 MG tablet Take 1 tablet (10 mg total) by mouth every 6 (six) hours as needed (Nausea or vomiting). 30 tablet 1  . promethazine (PHENERGAN) 25 MG/ML injection Inject 0.25-0.5 mLs (6.25-12.5 mg total) into the vein every 15 (fifteen) minutes as needed for nausea. 1 mL 0  . valsartan-hydrochlorothiazide (DIOVAN-HCT) 320-25 MG tablet Take 1 tablet by mouth daily.     No current facility-administered medications for this visit.    Facility-Administered Medications Ordered in Other Visits  Medication Dose Route Frequency Provider Last Rate Last Dose  . cyclophosphamide (CYTOXAN) 1,540 mg in sodium chloride 0.9 % 250 mL chemo infusion  600 mg/m2 (Treatment Plan Recorded) Intravenous Once [name], MD 654 mL/hr at 05/08/16 1617 1,540 mg at 05/08/16 1617  . heparin lock flush 100 unit/mL  500 Units Intracatheter Once PRN [name], MD      . pegfilgrastim (NEULASTA ONPRO KIT) injection 6 mg  6 mg Subcutaneous Once [name], MD      . sodium chloride flush (NS) 0.9 % injection 10 mL  10 mL Intravenous PRN [name], MD   10 mL at 04/12/16 1512  . sodium chloride flush (NS) 0.9 % injection 10 mL  10 mL Intracatheter PRN [name], MD        PHYSICAL EXAMINATION: ECOG PERFORMANCE STATUS: 1 - Symptomatic but completely ambulatory  Vitals:   05/08/16 1400  BP: 122/82  Pulse: 86  Resp: 16  Temp: 98.3 F (36.8 C)   Filed Weights   05/08/16 1400  Weight: 289 lb 11.2 oz (131.4 kg)    GENERAL:alert, no distress and comfortable SKIN: skin color, texture, turgor are normal, no  rashes or significant lesions EYES: normal, Conjunctiva are pink and non-injected, sclera clear OROPHARYNX:no exudate, no erythema and lips, buccal mucosa, and tongue normal  NECK: supple, thyroid normal size, non-tender, without nodularity LYMPH:  no palpable lymphadenopathy in the cervical, axillary or inguinal LUNGS: clear to auscultation and percussion with normal breathing effort HEART: regular rate & rhythm and no murmurs and no lower extremity edema ABDOMEN:abdomen soft, non-tender and normal bowel sounds MUSCULOSKELETAL:no cyanosis of digits and no clubbing  NEURO: alert & oriented x 3 with fluent speech, no focal motor/sensory deficits EXTREMITIES: No lower extremity edema, nail discoloration  LABORATORY DATA:  I have reviewed the data as listed   Chemistry      Component Value Date/Time   NA 141 05/08/2016 1313   K 3.1 (L) 05/08/2016 1313   CO2 28 05/08/2016 1313   BUN 18.0 05/08/2016 1313   CREATININE 0.8 05/08/2016 1313      Component Value Date/Time   CALCIUM 9.2  05/08/2016 1313   ALKPHOS 97 05/08/2016 1313   AST 31 05/08/2016 1313   ALT 20 05/08/2016 1313   BILITOT <0.22 05/08/2016 1313       Lab Results  Component Value Date   WBC 8.6 05/08/2016   HGB 10.6 (L) 05/08/2016   HCT 32.2 (L) 05/08/2016   MCV 79.2 (L) 05/08/2016   PLT 247 05/08/2016   NEUTROABS 7.4 (H) 05/08/2016    ASSESSMENT & PLAN:  Malignant neoplasm of upper-outer quadrant of left breast in female, estrogen receptor negative ([place]) Left lumpectomy 03/08/2016: IDC 2 cm, margins negative, 0/2 lymph nodes, grade 3, ER 0%, PR 0%, HER-2 negative ratio 1.31, Ki-67 40%, T1 CN 0 stage IA  Treatment plan: 1. Adjuvant chemotherapy with dose dense Adriamycin and Cytoxan 4 followed by weekly Taxol 12 started 04/17/2016 2. Followed by radiation therapy ----------------------------------------------------------------------------------------------------------------------------------------- Current  treatment: Cycle 3 day 1dose dense Adriamycin Cytoxan Echocardiogram 03/06/2016: EF 60-65% Closely monitoring for chemotherapy toxicities.  Chemotherapy toxicities: 1. Nausea grade 1 2. fatigue grade 1 3. Chemotherapy-induced anemia grade 1 Patient's blood work was reviewed.  Return to clinic in 2 weeks for cycle 1 of  Taxol. Patient will only receive 3 cycles of Adriamycin Cytoxan because of her prior Adriamycin exposure. Cardiology evaluation    I spent 25 minutes talking to the patient of which more than half was spent in counseling and coordination of care.  No orders of the defined types were placed in this encounter.  The patient has a good understanding of the overall plan. she agrees with it. she will call with any problems that may develop before the next visit here.   Rulon Eisenmenger, MD 05/08/16

## 2016-05-22 ENCOUNTER — Ambulatory Visit: Payer: BLUE CROSS/BLUE SHIELD

## 2016-05-22 ENCOUNTER — Encounter: Payer: Self-pay | Admitting: *Deleted

## 2016-05-22 ENCOUNTER — Encounter: Payer: Self-pay | Admitting: Hematology and Oncology

## 2016-05-22 ENCOUNTER — Ambulatory Visit (HOSPITAL_BASED_OUTPATIENT_CLINIC_OR_DEPARTMENT_OTHER): Payer: BLUE CROSS/BLUE SHIELD

## 2016-05-22 ENCOUNTER — Other Ambulatory Visit (HOSPITAL_BASED_OUTPATIENT_CLINIC_OR_DEPARTMENT_OTHER): Payer: BLUE CROSS/BLUE SHIELD

## 2016-05-22 ENCOUNTER — Ambulatory Visit (HOSPITAL_BASED_OUTPATIENT_CLINIC_OR_DEPARTMENT_OTHER): Payer: BLUE CROSS/BLUE SHIELD | Admitting: Hematology and Oncology

## 2016-05-22 VITALS — BP 123/65 | HR 86 | Temp 98.5°F | Resp 18

## 2016-05-22 DIAGNOSIS — D6481 Anemia due to antineoplastic chemotherapy: Secondary | ICD-10-CM

## 2016-05-22 DIAGNOSIS — Z95828 Presence of other vascular implants and grafts: Secondary | ICD-10-CM

## 2016-05-22 DIAGNOSIS — Z5111 Encounter for antineoplastic chemotherapy: Secondary | ICD-10-CM | POA: Diagnosis not present

## 2016-05-22 DIAGNOSIS — Z171 Estrogen receptor negative status [ER-]: Principal | ICD-10-CM

## 2016-05-22 DIAGNOSIS — C50412 Malignant neoplasm of upper-outer quadrant of left female breast: Secondary | ICD-10-CM

## 2016-05-22 LAB — CBC WITH DIFFERENTIAL/PLATELET
BASO%: 1 % (ref 0.0–2.0)
Basophils Absolute: 0 10*3/uL (ref 0.0–0.1)
EOS%: 1.4 % (ref 0.0–7.0)
Eosinophils Absolute: 0.1 10*3/uL (ref 0.0–0.5)
HCT: 30.2 % — ABNORMAL LOW (ref 34.8–46.6)
HGB: 10.2 g/dL — ABNORMAL LOW (ref 11.6–15.9)
LYMPH%: 5.7 % — AB (ref 14.0–49.7)
MCH: 27 pg (ref 25.1–34.0)
MCHC: 33.7 g/dL (ref 31.5–36.0)
MCV: 80.2 fL (ref 79.5–101.0)
MONO#: 0.9 10*3/uL (ref 0.1–0.9)
MONO%: 17.7 % — ABNORMAL HIGH (ref 0.0–14.0)
NEUT%: 74.2 % (ref 38.4–76.8)
NEUTROS ABS: 3.9 10*3/uL (ref 1.5–6.5)
Platelets: 256 10*3/uL (ref 145–400)
RBC: 3.77 10*6/uL (ref 3.70–5.45)
RDW: 16.3 % — ABNORMAL HIGH (ref 11.2–14.5)
WBC: 5.2 10*3/uL (ref 3.9–10.3)
lymph#: 0.3 10*3/uL — ABNORMAL LOW (ref 0.9–3.3)

## 2016-05-22 LAB — COMPREHENSIVE METABOLIC PANEL
ALT: 14 U/L (ref 0–55)
AST: 28 U/L (ref 5–34)
Albumin: 3.5 g/dL (ref 3.5–5.0)
Alkaline Phosphatase: 86 U/L (ref 40–150)
Anion Gap: 8 mEq/L (ref 3–11)
BUN: 16.4 mg/dL (ref 7.0–26.0)
CHLORIDE: 105 meq/L (ref 98–109)
CO2: 29 meq/L (ref 22–29)
CREATININE: 0.8 mg/dL (ref 0.6–1.1)
Calcium: 9.2 mg/dL (ref 8.4–10.4)
EGFR: >90 mL/min/{1.73_m2} (ref >90)
GLUCOSE: 91 mg/dL (ref 70–140)
Potassium: 3.4 mEq/L — ABNORMAL LOW (ref 3.5–5.1)
SODIUM: 143 meq/L (ref 136–145)
TOTAL PROTEIN: 6.7 g/dL (ref 6.4–8.3)
Total Bilirubin: 0.25 mg/dL (ref 0.20–1.20)

## 2016-05-22 MED ORDER — HEPARIN SOD (PORK) LOCK FLUSH 100 UNIT/ML IV SOLN
500.0000 [IU] | Freq: Once | INTRAVENOUS | Status: AC | PRN
  Administered 2016-05-22: 500 [IU]
  Filled 2016-05-22: qty 5

## 2016-05-22 MED ORDER — SODIUM CHLORIDE 0.9% FLUSH
10.0000 mL | INTRAVENOUS | Status: DC | PRN
  Administered 2016-05-22: 10 mL via INTRAVENOUS
  Filled 2016-05-22: qty 10

## 2016-05-22 MED ORDER — SODIUM CHLORIDE 0.9 % IV SOLN
20.0000 mg | Freq: Once | INTRAVENOUS | Status: AC
  Administered 2016-05-22: 20 mg via INTRAVENOUS
  Filled 2016-05-22: qty 2

## 2016-05-22 MED ORDER — FAMOTIDINE IN NACL 20-0.9 MG/50ML-% IV SOLN
20.0000 mg | Freq: Once | INTRAVENOUS | Status: AC
  Administered 2016-05-22: 20 mg via INTRAVENOUS

## 2016-05-22 MED ORDER — PACLITAXEL CHEMO INJECTION 300 MG/50ML
80.0000 mg/m2 | Freq: Once | INTRAVENOUS | Status: AC
  Administered 2016-05-22: 204 mg via INTRAVENOUS
  Filled 2016-05-22: qty 34

## 2016-05-22 MED ORDER — SODIUM CHLORIDE 0.9 % IV SOLN
Freq: Once | INTRAVENOUS | Status: AC
  Administered 2016-05-22: 11:00:00 via INTRAVENOUS

## 2016-05-22 MED ORDER — SODIUM CHLORIDE 0.9% FLUSH
10.0000 mL | INTRAVENOUS | Status: DC | PRN
  Administered 2016-05-22: 10 mL
  Filled 2016-05-22: qty 10

## 2016-05-22 MED ORDER — FAMOTIDINE IN NACL 20-0.9 MG/50ML-% IV SOLN
INTRAVENOUS | Status: AC
  Filled 2016-05-22: qty 50

## 2016-05-22 MED ORDER — DIPHENHYDRAMINE HCL 50 MG/ML IJ SOLN
INTRAMUSCULAR | Status: AC
  Filled 2016-05-22: qty 1

## 2016-05-22 MED ORDER — DIPHENHYDRAMINE HCL 50 MG/ML IJ SOLN
50.0000 mg | Freq: Once | INTRAMUSCULAR | Status: AC
  Administered 2016-05-22: 50 mg via INTRAVENOUS

## 2016-05-22 NOTE — Progress Notes (Signed)
Patient Care Team: [name], DO as PCP - General (Family Medicine) [name], MD as Consulting Physician (General Surgery) [name], MD as Consulting Physician (Hematology and Oncology) [name], MD as Consulting Physician (Radiation Oncology) [name], NP as Nurse Practitioner (Hematology and Oncology)  DIAGNOSIS:  Encounter Diagnosis  Name Primary?  . Malignant neoplasm of upper-outer quadrant of left breast in female, estrogen receptor negative ([place])     SUMMARY OF ONCOLOGIC HISTORY:   Malignant neoplasm of upper-outer quadrant of left breast in female, estrogen receptor negative ([place])   03/20/1995 Initial Biopsy    Head and Neck Cancer      02/09/2016 Initial Diagnosis    Left breast biopsy 2:00: IDC, left axillary lymph node biopsy negative, grade 3, ER 0%, PR 0%, Ki-67 40%, HER-2 negative ratio 1.31, screening detected left breast mass 1.8 cm, left axillary lymph node 5 mm benign, T1 CN 0 stage IA clinical stage      03/13/2016 Surgery    Left lumpectomy: IDC 2 cm, margins negative, 0/2 lymph nodes, grade 3, ER 0%, PR 0%, HER-2 negative ratio 1.31, Ki-67 40%, T1 CN 0 stage IA      04/10/2016 -  Chemotherapy    Adjuvant chemotherapy with dose dense Adriamycin and Cytoxan followed by Taxol weekly 12       CHIEF COMPLIANT: Cycle 1 Taxol  INTERVAL HISTORY: Denishia Citro is a 46 year old with above-mentioned history left breast cancer who underwent lumpectomy and is currently on adjuvant chemotherapy. She completed 3 cycles of dose dense Adriamycin Cytoxan. I did not give her the fourth cycle because I was concerned about the total cumulative dosing off Adriamycin in her lifetime. She is here today to start her first cycle of Taxol. She had done extremely well for the last cycle and denies any nausea vomiting. Her appetite is still excellent and she has gained 4 pounds.  REVIEW OF SYSTEMS:   Constitutional: Denies fevers, chills or  abnormal weight loss Eyes: Denies blurriness of vision Ears, nose, mouth, throat, and face: Denies mucositis or sore throat Respiratory: Denies cough, dyspnea or wheezes Cardiovascular: Denies palpitation, chest discomfort Gastrointestinal:  Denies nausea, heartburn or change in bowel habits Skin: Denies abnormal skin rashes Lymphatics: Denies new lymphadenopathy or easy bruising Neurological:Denies numbness, tingling or new weaknesses Behavioral/Psych: Mood is stable, no new changes  Extremities: No lower extremity edema Breast:  denies any pain or lumps or nodules in either breasts All other systems were reviewed with the patient and are negative.  I have reviewed the past medical history, past surgical history, social history and family history with the patient and they are unchanged from previous note.  ALLERGIES:  has No Known Allergies.  MEDICATIONS:  Current Outpatient Prescriptions  Medication Sig Dispense Refill  . albuterol (PROVENTIL HFA;VENTOLIN HFA) 108 (90 Base) MCG/ACT inhaler Inhale 2 puffs into the lungs every 6 (six) hours as needed for wheezing or shortness of breath. 1 Inhaler 2  . AMLODIPINE BESYLATE PO Take 10 mg by mouth.    . benzonatate (TESSALON) 100 MG capsule Take 1 capsule (100 mg total) by mouth 2 (two) times daily as needed for cough. 20 capsule 0  . cholecalciferol (VITAMIN D) 1000 units tablet Take 1,000 Units by mouth daily.    . fluticasone (FLONASE) 50 MCG/ACT nasal spray Place into both nostrils daily.    Marland Kitchen levothyroxine (SYNTHROID, LEVOTHROID) 200 MCG tablet Take 200 mcg by mouth daily before breakfast.    . levothyroxine (SYNTHROID,  LEVOTHROID) 50 MCG tablet Take 50 mcg by mouth daily before breakfast.    . lidocaine-prilocaine (EMLA) cream Apply to affected area once 30 g 3  . loratadine (CLARITIN) 10 MG tablet Take 10 mg by mouth daily.    Marland Kitchen LORazepam (ATIVAN) 0.5 MG tablet Take 1 tablet (0.5 mg total) by mouth at bedtime. 30 tablet 0  .  Naproxen Sodium (ALEVE) 220 MG CAPS Take 1 capsule by mouth. As needed for pain    . ondansetron (ZOFRAN) 8 MG tablet Take 1 tablet (8 mg total) by mouth 2 (two) times daily as needed. Start on the third day after chemotherapy. 30 tablet 1  . oxyCODONE (OXY IR/ROXICODONE) 5 MG immediate release tablet Take 1-2 tablets (5-10 mg total) by mouth every 6 (six) hours as needed for severe pain. 20 tablet 0  . potassium chloride (MICRO-K) 10 MEQ CR capsule Take 1 capsule (10 mEq total) by mouth 2 (two) times daily. 30 capsule 1  . prochlorperazine (COMPAZINE) 10 MG tablet Take 1 tablet (10 mg total) by mouth every 6 (six) hours as needed (Nausea or vomiting). 30 tablet 1  . promethazine (PHENERGAN) 25 MG/ML injection Inject 0.25-0.5 mLs (6.25-12.5 mg total) into the vein every 15 (fifteen) minutes as needed for nausea. 1 mL 0  . valsartan-hydrochlorothiazide (DIOVAN-HCT) 320-25 MG tablet Take 1 tablet by mouth daily.     No current facility-administered medications for this visit.    Facility-Administered Medications Ordered in Other Visits  Medication Dose Route Frequency Provider Last Rate Last Dose  . sodium chloride flush (NS) 0.9 % injection 10 mL  10 mL Intravenous PRN [name], MD   10 mL at 04/12/16 1512  . sodium chloride flush (NS) 0.9 % injection 10 mL  10 mL Intracatheter PRN [name], MD   10 mL at 05/22/16 1312    PHYSICAL EXAMINATION: ECOG PERFORMANCE STATUS: 1 - Symptomatic but completely ambulatory  Vitals:   05/22/16 0920  BP: (!) 143/80  Pulse: 95  Resp: 18  Temp: 98.2 F (36.8 C)   Filed Weights   05/22/16 0920  Weight: 294 lb (133.4 kg)    GENERAL:alert, no distress and comfortable SKIN: skin color, texture, turgor are normal, no rashes or significant lesions EYES: normal, Conjunctiva are pink and non-injected, sclera clear OROPHARYNX:no exudate, no erythema and lips, buccal mucosa, and tongue normal  NECK: supple, thyroid normal size, non-tender, without  nodularity LYMPH:  no palpable lymphadenopathy in the cervical, axillary or inguinal LUNGS: clear to auscultation and percussion with normal breathing effort HEART: regular rate & rhythm and no murmurs and no lower extremity edema ABDOMEN:abdomen soft, non-tender and normal bowel sounds MUSCULOSKELETAL:no cyanosis of digits and no clubbing  NEURO: alert & oriented x 3 with fluent speech, no focal motor/sensory deficits EXTREMITIES: No lower extremity edema  LABORATORY DATA:  I have reviewed the data as listed   Chemistry      Component Value Date/Time   NA 143 05/22/2016 0848   K 3.4 (L) 05/22/2016 0848   CO2 29 05/22/2016 0848   BUN 16.4 05/22/2016 0848   CREATININE 0.8 05/22/2016 0848      Component Value Date/Time   CALCIUM 9.2 05/22/2016 0848   ALKPHOS 86 05/22/2016 0848   AST 28 05/22/2016 0848   ALT 14 05/22/2016 0848   BILITOT 0.25 05/22/2016 0848       Lab Results  Component Value Date   WBC 5.2 05/22/2016   HGB 10.2 (L) 05/22/2016   HCT  30.2 (L) 05/22/2016   MCV 80.2 05/22/2016   PLT 256 05/22/2016   NEUTROABS 3.9 05/22/2016    ASSESSMENT & PLAN:  Malignant neoplasm of upper-outer quadrant of left breast in female, estrogen receptor negative ([place]) Left lumpectomy 03/08/2016: IDC 2 cm, margins negative, 0/2 lymph nodes, grade 3, ER 0%, PR 0%, HER-2 negative ratio 1.31, Ki-67 40%, T1 CN 0 stage IA  Treatment plan: 1. Adjuvant chemotherapy with dose dense Adriamycin and Cytoxan 4 followed by weekly Taxol 12 started 04/17/2016 2. Followed by radiation therapy ----------------------------------------------------------------------------------------------------------------------------------------- Current treatment: Cycle 1Taxol Echocardiogram 03/06/2016: EF 60-65% Closely monitoring for chemotherapy toxicities.  Chemotherapy toxicities: 1. Nausea grade 1 2. fatigue grade 1 3. Chemotherapy-induced anemia grade 1 Patient's blood work was  reviewed.  Return to clinic in 1 weekfor cycle 2of  Taxol.   I spent 25 minutes talking to the patient of which more than half was spent in counseling and coordination of care.  No orders of the defined types were placed in this encounter.  The patient has a good understanding of the overall plan. she agrees with it. she will call with any problems that may develop before the next visit here.   Rulon Eisenmenger, MD 05/22/16

## 2016-05-22 NOTE — Progress Notes (Signed)
Pt and VS stable at discharge. 

## 2016-05-22 NOTE — Patient Instructions (Signed)
[facility] Discharge Instructions for Patients Receiving Chemotherapy  Today you received the following chemotherapy agents: Taxol.  To help prevent nausea and vomiting after your treatment, we encourage you to take your nausea medication: Compazine. Take one every 6 hours as needed.   If you develop nausea and vomiting that is not controlled by your nausea medication, call the clinic.   BELOW ARE SYMPTOMS THAT SHOULD BE REPORTED IMMEDIATELY:  *FEVER GREATER THAN 100.5 F  *CHILLS WITH OR WITHOUT FEVER  NAUSEA AND VOMITING THAT IS NOT CONTROLLED WITH YOUR NAUSEA MEDICATION  *UNUSUAL SHORTNESS OF BREATH  *UNUSUAL BRUISING OR BLEEDING  TENDERNESS IN MOUTH AND THROAT WITH OR WITHOUT PRESENCE OF ULCERS  *URINARY PROBLEMS  *BOWEL PROBLEMS  UNUSUAL RASH Items with * indicate a potential emergency and should be followed up as soon as possible.  Feel free to call the clinic should you have any questions or concerns. The clinic phone number is [phone].  Please show the CHEMO ALERT CARD at check-in to the Emergency Department and triage nurse.   

## 2016-05-22 NOTE — Assessment & Plan Note (Signed)
Left lumpectomy 03/08/2016: IDC 2 cm, margins negative, 0/2 lymph nodes, grade 3, ER 0%, PR 0%, HER-2 negative ratio 1.31, Ki-67 40%, T1 CN 0 stage IA  Treatment plan: 1. Adjuvant chemotherapy with dose dense Adriamycin and Cytoxan 4 followed by weekly Taxol 12 started 04/17/2016 2. Followed by radiation therapy ----------------------------------------------------------------------------------------------------------------------------------------- Current treatment: Cycle 1Taxol Echocardiogram 03/06/2016: EF 60-65% Closely monitoring for chemotherapy toxicities.  Chemotherapy toxicities: 1. Nausea grade 1 2. fatigue grade 1 3. Chemotherapy-induced anemia grade 1 Patient's blood work was reviewed.  Return to clinic in 1 weekfor cycle 2of  Taxol.

## 2016-05-22 NOTE — Patient Instructions (Signed)
Implanted Port Home Guide An implanted port is a type of central line that is placed under the skin. Central lines are used to provide IV access when treatment or nutrition needs to be given through a person's veins. Implanted ports are used for long-term IV access. An implanted port may be placed because:  You need IV medicine that would be irritating to the small veins in your hands or arms.  You need long-term IV medicines, such as antibiotics.  You need IV nutrition for a long period.  You need frequent blood draws for lab tests.  You need dialysis.  Implanted ports are usually placed in the chest area, but they can also be placed in the upper arm, the abdomen, or the leg. An implanted port has two main parts:  Reservoir. The reservoir is round and will appear as a small, raised area under your skin. The reservoir is the part where a needle is inserted to give medicines or draw blood.  Catheter. The catheter is a thin, flexible tube that extends from the reservoir. The catheter is placed into a large vein. Medicine that is inserted into the reservoir goes into the catheter and then into the vein.  How will I care for my incision site? Do not get the incision site wet. Bathe or shower as directed by your health care provider. How is my port accessed? Special steps must be taken to access the port:  Before the port is accessed, a numbing cream can be placed on the skin. This helps numb the skin over the port site.  Your health care provider uses a sterile technique to access the port. ? Your health care provider must put on a mask and sterile gloves. ? The skin over your port is cleaned carefully with an antiseptic and allowed to dry. ? The port is gently pinched between sterile gloves, and a needle is inserted into the port.  Only "non-coring" port needles should be used to access the port. Once the port is accessed, a blood return should be checked. This helps ensure that the port  is in the vein and is not clogged.  If your port needs to remain accessed for a constant infusion, a clear (transparent) bandage will be placed over the needle site. The bandage and needle will need to be changed every week, or as directed by your health care provider.  Keep the bandage covering the needle clean and dry. Do not get it wet. Follow your health care provider's instructions on how to take a shower or bath while the port is accessed.  If your port does not need to stay accessed, no bandage is needed over the port.  What is flushing? Flushing helps keep the port from getting clogged. Follow your health care provider's instructions on how and when to flush the port. Ports are usually flushed with saline solution or a medicine called heparin. The need for flushing will depend on how the port is used.  If the port is used for intermittent medicines or blood draws, the port will need to be flushed: ? After medicines have been given. ? After blood has been drawn. ? As part of routine maintenance.  If a constant infusion is running, the port may not need to be flushed.  How long will my port stay implanted? The port can stay in for as long as your health care provider thinks it is needed. When it is time for the port to come out, surgery will be   done to remove it. The procedure is similar to the one performed when the port was put in. When should I seek immediate medical care? When you have an implanted port, you should seek immediate medical care if:  You notice a bad smell coming from the incision site.  You have swelling, redness, or drainage at the incision site.  You have more swelling or pain at the port site or the surrounding area.  You have a fever that is not controlled with medicine.  This information is not intended to replace advice given to you by your health care provider. Make sure you discuss any questions you have with your health care provider. Document  Released: 12/18/2004 Document Revised: 05/26/2015 Document Reviewed: 08/25/2012 Elsevier Interactive Patient Education  2017 [employer]  

## 2016-05-23 ENCOUNTER — Telehealth: Payer: Self-pay

## 2016-05-23 NOTE — Telephone Encounter (Signed)
Called pt to follow up on post first time chemo. Pt states that she is feeling great and has no issues at this time. She is hydrating and eating well. She has all the information that she needs as far as managing symptoms at home. No further questions at this time.

## 2016-05-29 ENCOUNTER — Ambulatory Visit: Payer: BLUE CROSS/BLUE SHIELD

## 2016-05-29 ENCOUNTER — Ambulatory Visit (HOSPITAL_BASED_OUTPATIENT_CLINIC_OR_DEPARTMENT_OTHER): Payer: BLUE CROSS/BLUE SHIELD

## 2016-05-29 ENCOUNTER — Other Ambulatory Visit (HOSPITAL_BASED_OUTPATIENT_CLINIC_OR_DEPARTMENT_OTHER): Payer: BLUE CROSS/BLUE SHIELD

## 2016-05-29 ENCOUNTER — Ambulatory Visit (HOSPITAL_BASED_OUTPATIENT_CLINIC_OR_DEPARTMENT_OTHER): Payer: BLUE CROSS/BLUE SHIELD | Admitting: Hematology and Oncology

## 2016-05-29 ENCOUNTER — Other Ambulatory Visit: Payer: Self-pay | Admitting: Emergency Medicine

## 2016-05-29 ENCOUNTER — Encounter: Payer: Self-pay | Admitting: Hematology and Oncology

## 2016-05-29 DIAGNOSIS — Z171 Estrogen receptor negative status [ER-]: Principal | ICD-10-CM

## 2016-05-29 DIAGNOSIS — R530 Neoplastic (malignant) related fatigue: Secondary | ICD-10-CM

## 2016-05-29 DIAGNOSIS — M791 Myalgia: Secondary | ICD-10-CM | POA: Diagnosis not present

## 2016-05-29 DIAGNOSIS — C50412 Malignant neoplasm of upper-outer quadrant of left female breast: Secondary | ICD-10-CM

## 2016-05-29 DIAGNOSIS — R110 Nausea: Secondary | ICD-10-CM | POA: Diagnosis not present

## 2016-05-29 DIAGNOSIS — D6481 Anemia due to antineoplastic chemotherapy: Secondary | ICD-10-CM

## 2016-05-29 DIAGNOSIS — Z95828 Presence of other vascular implants and grafts: Secondary | ICD-10-CM

## 2016-05-29 DIAGNOSIS — Z5111 Encounter for antineoplastic chemotherapy: Secondary | ICD-10-CM

## 2016-05-29 LAB — COMPREHENSIVE METABOLIC PANEL
ALBUMIN: 3.5 g/dL (ref 3.5–5.0)
ALK PHOS: 76 U/L (ref 40–150)
ALT: 20 U/L (ref 0–55)
ANION GAP: 9 meq/L (ref 3–11)
AST: 30 U/L (ref 5–34)
BILIRUBIN TOTAL: 0.26 mg/dL (ref 0.20–1.20)
BUN: 14.7 mg/dL (ref 7.0–26.0)
CALCIUM: 9.4 mg/dL (ref 8.4–10.4)
CO2: 28 mEq/L (ref 22–29)
Chloride: 101 mEq/L (ref 98–109)
Creatinine: 0.8 mg/dL (ref 0.6–1.1)
Glucose: 95 mg/dl (ref 70–140)
Potassium: 3.2 mEq/L — ABNORMAL LOW (ref 3.5–5.1)
Sodium: 139 mEq/L (ref 136–145)
TOTAL PROTEIN: 6.6 g/dL (ref 6.4–8.3)

## 2016-05-29 LAB — CBC WITH DIFFERENTIAL/PLATELET
BASO%: 0.5 % (ref 0.0–2.0)
Basophils Absolute: 0 10*3/uL (ref 0.0–0.1)
EOS ABS: 0.1 10*3/uL (ref 0.0–0.5)
EOS%: 1.8 % (ref 0.0–7.0)
HEMATOCRIT: 30.3 % — AB (ref 34.8–46.6)
HEMOGLOBIN: 9.8 g/dL — AB (ref 11.6–15.9)
LYMPH%: 6.2 % — AB (ref 14.0–49.7)
MCH: 26.8 pg (ref 25.1–34.0)
MCHC: 32.3 g/dL (ref 31.5–36.0)
MCV: 83 fL (ref 79.5–101.0)
MONO#: 0.4 10*3/uL (ref 0.1–0.9)
MONO%: 7 % (ref 0.0–14.0)
NEUT%: 84.5 % — ABNORMAL HIGH (ref 38.4–76.8)
NEUTROS ABS: 5 10*3/uL (ref 1.5–6.5)
PLATELETS: 315 10*3/uL (ref 145–400)
RBC: 3.65 10*6/uL — AB (ref 3.70–5.45)
RDW: 18.7 % — ABNORMAL HIGH (ref 11.2–14.5)
WBC: 6 10*3/uL (ref 3.9–10.3)
lymph#: 0.4 10*3/uL — ABNORMAL LOW (ref 0.9–3.3)

## 2016-05-29 MED ORDER — POTASSIUM CHLORIDE 20 MEQ/15ML (10%) PO SOLN: 10.0000 meq | Freq: Two times a day (BID) | ORAL | 1 refills | Status: DC

## 2016-05-29 MED ORDER — DIPHENHYDRAMINE HCL 50 MG/ML IJ SOLN
INTRAMUSCULAR | Status: AC
  Filled 2016-05-29: qty 1

## 2016-05-29 MED ORDER — FAMOTIDINE IN NACL 20-0.9 MG/50ML-% IV SOLN
INTRAVENOUS | Status: AC
  Filled 2016-05-29: qty 50

## 2016-05-29 MED ORDER — DIPHENHYDRAMINE HCL 50 MG/ML IJ SOLN
50.0000 mg | Freq: Once | INTRAMUSCULAR | Status: AC
  Administered 2016-05-29: 50 mg via INTRAVENOUS

## 2016-05-29 MED ORDER — SODIUM CHLORIDE 0.9% FLUSH
10.0000 mL | INTRAVENOUS | Status: DC | PRN
  Administered 2016-05-29: 10 mL via INTRAVENOUS
  Filled 2016-05-29: qty 10

## 2016-05-29 MED ORDER — SODIUM CHLORIDE 0.9 % IV SOLN
20.0000 mg | Freq: Once | INTRAVENOUS | Status: AC
  Administered 2016-05-29: 20 mg via INTRAVENOUS
  Filled 2016-05-29: qty 2

## 2016-05-29 MED ORDER — SODIUM CHLORIDE 0.9% FLUSH
10.0000 mL | INTRAVENOUS | Status: DC | PRN
  Administered 2016-05-29: 10 mL
  Filled 2016-05-29: qty 10

## 2016-05-29 MED ORDER — SODIUM CHLORIDE 0.9 % IV SOLN
Freq: Once | INTRAVENOUS | Status: AC
  Administered 2016-05-29: 15:00:00 via INTRAVENOUS

## 2016-05-29 MED ORDER — FAMOTIDINE IN NACL 20-0.9 MG/50ML-% IV SOLN
20.0000 mg | Freq: Once | INTRAVENOUS | Status: AC
  Administered 2016-05-29: 20 mg via INTRAVENOUS

## 2016-05-29 MED ORDER — PACLITAXEL CHEMO INJECTION 300 MG/50ML
80.0000 mg/m2 | Freq: Once | INTRAVENOUS | Status: AC
  Administered 2016-05-29: 204 mg via INTRAVENOUS
  Filled 2016-05-29: qty 34

## 2016-05-29 MED ORDER — HEPARIN SOD (PORK) LOCK FLUSH 100 UNIT/ML IV SOLN
500.0000 [IU] | Freq: Once | INTRAVENOUS | Status: AC | PRN
  Administered 2016-05-29: 500 [IU]
  Filled 2016-05-29: qty 5

## 2016-05-29 NOTE — Progress Notes (Signed)
Patient Care Team: Carla Heinrich, DO as PCP - General (Family Medicine) Carla Luna, MD as Consulting Physician (General Surgery) Carla Lose, MD as Consulting Physician (Hematology and Oncology) Carla Rudd, MD as Consulting Physician (Radiation Oncology) Carla Bison Charlestine Massed, NP as Nurse Practitioner (Hematology and Oncology)  DIAGNOSIS:  Encounter Diagnosis  Name Primary?  . Malignant neoplasm of upper-outer quadrant of left breast in female, estrogen receptor negative ([place])     SUMMARY OF ONCOLOGIC HISTORY:   Malignant neoplasm of upper-outer quadrant of left breast in female, estrogen receptor negative (Carla Pierce)   03/20/1995 Initial Biopsy    Head and Neck Cancer      02/09/2016 Initial Diagnosis    Left breast biopsy 2:00: IDC, left axillary lymph node biopsy negative, grade 3, ER 0%, PR 0%, Ki-67 40%, HER-2 negative ratio 1.31, screening detected left breast mass 1.8 cm, left axillary lymph node 5 mm benign, T1 CN 0 stage IA clinical stage      03/13/2016 Surgery    Left lumpectomy: IDC 2 cm, margins negative, 0/2 lymph nodes, grade 3, ER 0%, PR 0%, HER-2 negative ratio 1.31, Ki-67 40%, T1 CN 0 stage IA      04/10/2016 -  Chemotherapy    Adjuvant chemotherapy with dose dense Adriamycin and Cytoxan followed by Taxol weekly 12       CHIEF COMPLIANT: Cycle 2 Taxol  INTERVAL HISTORY: Carla Pierce is a 46 year old with above-mentioned history of left breast cancer currently on adjuvant chemotherapy with Taxol. Today is cycle 2. After cycle 1 she had lot of muscle aches and pains especially in the thighs. This lasted for about a day and it got resolved by taking Tylenol. Did not have any nausea vomiting.  REVIEW OF SYSTEMS:   Constitutional: Denies fevers, chills or abnormal weight loss Eyes: Denies blurriness of vision Ears, nose, mouth, throat, and face: Denies mucositis or sore throat Respiratory: Denies cough, dyspnea or wheezes Cardiovascular: Denies  palpitation, chest discomfort Gastrointestinal:  Denies nausea, heartburn or change in bowel habits Skin: Denies abnormal skin rashes Lymphatics: Denies new lymphadenopathy or easy bruising Neurological:Denies numbness, tingling or new weaknesses Behavioral/Psych: Mood is stable, no new changes  Extremities: Bilateral thigh discomfort Breast:  denies any pain or lumps or nodules in either breasts All other systems were reviewed with the patient and are negative.  I have reviewed the past medical history, past surgical history, social history and family history with the patient and they are unchanged from previous note.  ALLERGIES:  has No Known Allergies.  MEDICATIONS:  Current Outpatient Prescriptions  Medication Sig Dispense Refill  . albuterol (PROVENTIL HFA;VENTOLIN HFA) 108 (90 Base) MCG/ACT inhaler Inhale 2 puffs into the lungs every 6 (six) hours as needed for wheezing or shortness of breath. 1 Inhaler 2  . AMLODIPINE BESYLATE PO Take 10 mg by mouth.    . benzonatate (TESSALON) 100 MG capsule Take 1 capsule (100 mg total) by mouth 2 (two) times daily as needed for cough. 20 capsule 0  . cholecalciferol (VITAMIN D) 1000 units tablet Take 1,000 Units by mouth daily.    . fluticasone (FLONASE) 50 MCG/ACT nasal spray Place into both nostrils daily.    Marland Kitchen levothyroxine (SYNTHROID, LEVOTHROID) 200 MCG tablet Take 200 mcg by mouth daily before breakfast.    . levothyroxine (SYNTHROID, LEVOTHROID) 50 MCG tablet Take 50 mcg by mouth daily before breakfast.    . lidocaine-prilocaine (EMLA) cream Apply to affected area once 30 g 3  . loratadine (CLARITIN) 10  MG tablet Take 10 mg by mouth daily.    Marland Kitchen LORazepam (ATIVAN) 0.5 MG tablet Take 1 tablet (0.5 mg total) by mouth at bedtime. 30 tablet 0  . Naproxen Sodium (ALEVE) 220 MG CAPS Take 1 capsule by mouth. As needed for pain    . ondansetron (ZOFRAN) 8 MG tablet Take 1 tablet (8 mg total) by mouth 2 (two) times daily as needed. Start on the  third day after chemotherapy. 30 tablet 1  . oxyCODONE (OXY IR/ROXICODONE) 5 MG immediate release tablet Take 1-2 tablets (5-10 mg total) by mouth every 6 (six) hours as needed for severe pain. 20 tablet 0  . potassium chloride (MICRO-K) 10 MEQ CR capsule Take 1 capsule (10 mEq total) by mouth 2 (two) times daily. 30 capsule 1  . prochlorperazine (COMPAZINE) 10 MG tablet Take 1 tablet (10 mg total) by mouth every 6 (six) hours as needed (Nausea or vomiting). 30 tablet 1  . promethazine (PHENERGAN) 25 MG/ML injection Inject 0.25-0.5 mLs (6.25-12.5 mg total) into the vein every 15 (fifteen) minutes as needed for nausea. 1 mL 0  . valsartan-hydrochlorothiazide (DIOVAN-HCT) 320-25 MG tablet Take 1 tablet by mouth daily.     No current facility-administered medications for this visit.    Facility-Administered Medications Ordered in Other Visits  Medication Dose Route Frequency Provider Last Rate Last Dose  . sodium chloride flush (NS) 0.9 % injection 10 mL  10 mL Intravenous PRN Carla Lose, MD   10 mL at 04/12/16 1512    PHYSICAL EXAMINATION: ECOG PERFORMANCE STATUS: 1 - Symptomatic but completely ambulatory  Vitals:   05/29/16 1432  BP: (!) 173/77  Pulse: 83  Resp: 18  Temp: 98 F (36.7 C)   Filed Weights   05/29/16 1432  Weight: 293 lb 4.8 oz (133 kg)    GENERAL:alert, no distress and comfortable SKIN: skin color, texture, turgor are normal, no rashes or significant lesions EYES: normal, Conjunctiva are pink and non-injected, sclera clear OROPHARYNX:no exudate, no erythema and lips, buccal mucosa, and tongue normal  NECK: supple, thyroid normal size, non-tender, without nodularity LYMPH:  no palpable lymphadenopathy in the cervical, axillary or inguinal LUNGS: clear to auscultation and percussion with normal breathing effort HEART: regular rate & rhythm and no murmurs and no lower extremity edema ABDOMEN:abdomen soft, non-tender and normal bowel sounds MUSCULOSKELETAL:no  cyanosis of digits and no clubbing  NEURO: alert & oriented x 3 with fluent speech, no focal motor/sensory deficits EXTREMITIES: No lower extremity edema  LABORATORY DATA:  I have reviewed the data as listed   Chemistry      Component Value Date/Time   NA 143 05/22/2016 0848   K 3.4 (L) 05/22/2016 0848   CO2 29 05/22/2016 0848   BUN 16.4 05/22/2016 0848   CREATININE 0.8 05/22/2016 0848      Component Value Date/Time   CALCIUM 9.2 05/22/2016 0848   ALKPHOS 86 05/22/2016 0848   AST 28 05/22/2016 0848   ALT 14 05/22/2016 0848   BILITOT 0.25 05/22/2016 0848       Lab Results  Component Value Date   WBC 6.0 05/29/2016   HGB 9.8 (L) 05/29/2016   HCT 30.3 (L) 05/29/2016   MCV 83.0 05/29/2016   PLT 315 05/29/2016   NEUTROABS 5.0 05/29/2016    ASSESSMENT & PLAN:  Malignant neoplasm of upper-outer quadrant of left breast in female, estrogen receptor negative ([place]) Left lumpectomy 03/08/2016: IDC 2 cm, margins negative, 0/2 lymph nodes, grade 3, ER 0%, PR 0%,  HER-2 negative ratio 1.31, Ki-67 40%, T1 CN 0 stage IA  Treatment plan: 1. Adjuvant chemotherapy with dose dense Adriamycin and Cytoxan 4 followed by weekly Taxol 12 started 04/17/2016 2. Followed by radiation therapy ----------------------------------------------------------------------------------------------------------------------------------------- Current treatment: Cycle 2 Taxol Echocardiogram 03/06/2016: EF 60-65% Closely monitoring for chemotherapy toxicities.  Chemotherapy toxicities: 1. Nausea grade 1 2. fatigue grade 1 3. Chemotherapy-induced anemia grade 2: Monitoring closely 4. Bilateral muscle pain in the thighs: Encouraged her to drink more water  Patient's blood work was reviewed.  Return to clinic in 2 weekfor cycle 4of Taxol.   I spent 25 minutes talking to the patient of which more than half was spent in counseling and coordination of care.  No orders of the defined types were placed  in this encounter.  The patient has a good understanding of the overall plan. she agrees with it. she will call with any problems that may develop before the next visit here.   Rulon Eisenmenger, MD 05/29/16

## 2016-05-29 NOTE — Assessment & Plan Note (Signed)
Left lumpectomy 03/08/2016: IDC 2 cm, margins negative, 0/2 lymph nodes, grade 3, ER 0%, PR 0%, HER-2 negative ratio 1.31, Ki-67 40%, T1 CN 0 stage IA  Treatment plan: 1. Adjuvant chemotherapy with dose dense Adriamycin and Cytoxan 4 followed by weekly Taxol 12 started 04/17/2016 2. Followed by radiation therapy ----------------------------------------------------------------------------------------------------------------------------------------- Current treatment: Cycle 2 Taxol Echocardiogram 03/06/2016: EF 60-65% Closely monitoring for chemotherapy toxicities.  Chemotherapy toxicities: 1. Nausea grade 1 2. fatigue grade 1 3. Chemotherapy-induced anemia grade 1 Patient's blood work was reviewed.  Return to clinic in 2 weekfor cycle 4of Taxol.

## 2016-05-29 NOTE — Patient Instructions (Signed)
[facility] Discharge Instructions for Patients Receiving Chemotherapy  Today you received the following chemotherapy agents Taxol  To help prevent nausea and vomiting after your treatment, we encourage you to take your nausea medication   If you develop nausea and vomiting that is not controlled by your nausea medication, call the clinic.   BELOW ARE SYMPTOMS THAT SHOULD BE REPORTED IMMEDIATELY:  *FEVER GREATER THAN 100.5 F  *CHILLS WITH OR WITHOUT FEVER  NAUSEA AND VOMITING THAT IS NOT CONTROLLED WITH YOUR NAUSEA MEDICATION  *UNUSUAL SHORTNESS OF BREATH  *UNUSUAL BRUISING OR BLEEDING  TENDERNESS IN MOUTH AND THROAT WITH OR WITHOUT PRESENCE OF ULCERS  *URINARY PROBLEMS  *BOWEL PROBLEMS  UNUSUAL RASH Items with * indicate a potential emergency and should be followed up as soon as possible.  Feel free to call the clinic you have any questions or concerns. The clinic phone number is [phone].  Please show the CHEMO ALERT CARD at check-in to the Emergency Department and triage nurse.   

## 2016-06-05 ENCOUNTER — Ambulatory Visit (HOSPITAL_BASED_OUTPATIENT_CLINIC_OR_DEPARTMENT_OTHER): Payer: BLUE CROSS/BLUE SHIELD

## 2016-06-05 ENCOUNTER — Ambulatory Visit: Payer: BLUE CROSS/BLUE SHIELD

## 2016-06-05 ENCOUNTER — Ambulatory Visit: Payer: BLUE CROSS/BLUE SHIELD | Admitting: Hematology and Oncology

## 2016-06-05 ENCOUNTER — Other Ambulatory Visit (HOSPITAL_BASED_OUTPATIENT_CLINIC_OR_DEPARTMENT_OTHER): Payer: BLUE CROSS/BLUE SHIELD

## 2016-06-05 VITALS — BP 124/90 | HR 76 | Temp 97.9°F | Resp 18

## 2016-06-05 DIAGNOSIS — Z171 Estrogen receptor negative status [ER-]: Principal | ICD-10-CM

## 2016-06-05 DIAGNOSIS — Z95828 Presence of other vascular implants and grafts: Secondary | ICD-10-CM

## 2016-06-05 DIAGNOSIS — C50412 Malignant neoplasm of upper-outer quadrant of left female breast: Secondary | ICD-10-CM

## 2016-06-05 DIAGNOSIS — Z5111 Encounter for antineoplastic chemotherapy: Secondary | ICD-10-CM | POA: Diagnosis not present

## 2016-06-05 LAB — COMPREHENSIVE METABOLIC PANEL
ALK PHOS: 78 U/L (ref 40–150)
ALT: 22 U/L (ref 0–55)
ANION GAP: 10 meq/L (ref 3–11)
AST: 30 U/L (ref 5–34)
Albumin: 3.4 g/dL — ABNORMAL LOW (ref 3.5–5.0)
BUN: 16.8 mg/dL (ref 7.0–26.0)
CO2: 30 mEq/L — ABNORMAL HIGH (ref 22–29)
Calcium: 9.2 mg/dL (ref 8.4–10.4)
Chloride: 104 mEq/L (ref 98–109)
Creatinine: 0.9 mg/dL (ref 0.6–1.1)
GLUCOSE: 92 mg/dL (ref 70–140)
POTASSIUM: 3.4 meq/L — AB (ref 3.5–5.1)
Sodium: 143 mEq/L (ref 136–145)
Total Bilirubin: <0.22 mg/dL (ref 0.20–1.20)
Total Protein: 6.5 g/dL (ref 6.4–8.3)

## 2016-06-05 LAB — CBC WITH DIFFERENTIAL/PLATELET
BASO%: 1.3 % (ref 0.0–2.0)
BASOS ABS: 0 10*3/uL (ref 0.0–0.1)
EOS ABS: 0.1 10*3/uL (ref 0.0–0.5)
EOS%: 2.8 % (ref 0.0–7.0)
HCT: 30.6 % — ABNORMAL LOW (ref 34.8–46.6)
HGB: 10.2 g/dL — ABNORMAL LOW (ref 11.6–15.9)
LYMPH%: 6.4 % — AB (ref 14.0–49.7)
MCH: 27.7 pg (ref 25.1–34.0)
MCHC: 33.5 g/dL (ref 31.5–36.0)
MCV: 82.7 fL (ref 79.5–101.0)
MONO#: 0.3 10*3/uL (ref 0.1–0.9)
MONO%: 8.6 % (ref 0.0–14.0)
NEUT#: 3.1 10*3/uL (ref 1.5–6.5)
NEUT%: 80.9 % — ABNORMAL HIGH (ref 38.4–76.8)
PLATELETS: 332 10*3/uL (ref 145–400)
RBC: 3.7 10*6/uL (ref 3.70–5.45)
RDW: 20.3 % — ABNORMAL HIGH (ref 11.2–14.5)
WBC: 3.8 10*3/uL — ABNORMAL LOW (ref 3.9–10.3)
lymph#: 0.2 10*3/uL — ABNORMAL LOW (ref 0.9–3.3)

## 2016-06-05 MED ORDER — FAMOTIDINE IN NACL 20-0.9 MG/50ML-% IV SOLN
20.0000 mg | Freq: Once | INTRAVENOUS | Status: AC
  Administered 2016-06-05: 20 mg via INTRAVENOUS

## 2016-06-05 MED ORDER — HEPARIN SOD (PORK) LOCK FLUSH 100 UNIT/ML IV SOLN
500.0000 [IU] | Freq: Once | INTRAVENOUS | Status: AC | PRN
  Administered 2016-06-05: 500 [IU]
  Filled 2016-06-05: qty 5

## 2016-06-05 MED ORDER — FAMOTIDINE IN NACL 20-0.9 MG/50ML-% IV SOLN
INTRAVENOUS | Status: AC
  Filled 2016-06-05: qty 50

## 2016-06-05 MED ORDER — DIPHENHYDRAMINE HCL 50 MG/ML IJ SOLN
INTRAMUSCULAR | Status: AC
  Filled 2016-06-05: qty 1

## 2016-06-05 MED ORDER — PACLITAXEL CHEMO INJECTION 300 MG/50ML
80.0000 mg/m2 | Freq: Once | INTRAVENOUS | Status: AC
  Administered 2016-06-05: 204 mg via INTRAVENOUS
  Filled 2016-06-05: qty 34

## 2016-06-05 MED ORDER — DIPHENHYDRAMINE HCL 50 MG/ML IJ SOLN
50.0000 mg | Freq: Once | INTRAMUSCULAR | Status: AC
  Administered 2016-06-05: 50 mg via INTRAVENOUS

## 2016-06-05 MED ORDER — SODIUM CHLORIDE 0.9% FLUSH
10.0000 mL | INTRAVENOUS | Status: DC | PRN
  Administered 2016-06-05: 10 mL via INTRAVENOUS
  Filled 2016-06-05: qty 10

## 2016-06-05 MED ORDER — SODIUM CHLORIDE 0.9 % IV SOLN
Freq: Once | INTRAVENOUS | Status: AC
  Administered 2016-06-05: 10:00:00 via INTRAVENOUS

## 2016-06-05 MED ORDER — SODIUM CHLORIDE 0.9% FLUSH
10.0000 mL | INTRAVENOUS | Status: DC | PRN
  Administered 2016-06-05: 10 mL
  Filled 2016-06-05: qty 10

## 2016-06-05 MED ORDER — SODIUM CHLORIDE 0.9 % IV SOLN
20.0000 mg | Freq: Once | INTRAVENOUS | Status: AC
  Administered 2016-06-05: 20 mg via INTRAVENOUS
  Filled 2016-06-05: qty 2

## 2016-06-05 NOTE — Patient Instructions (Addendum)
[facility] Discharge Instructions for Patients Receiving Chemotherapy  Today you received the following chemotherapy agents Taxol  To help prevent nausea and vomiting after your treatment, we encourage you to take your nausea medication   If you develop nausea and vomiting that is not controlled by your nausea medication, call the clinic.   BELOW ARE SYMPTOMS THAT SHOULD BE REPORTED IMMEDIATELY:  *FEVER GREATER THAN 100.5 F  *CHILLS WITH OR WITHOUT FEVER  NAUSEA AND VOMITING THAT IS NOT CONTROLLED WITH YOUR NAUSEA MEDICATION  *UNUSUAL SHORTNESS OF BREATH  *UNUSUAL BRUISING OR BLEEDING  TENDERNESS IN MOUTH AND THROAT WITH OR WITHOUT PRESENCE OF ULCERS  *URINARY PROBLEMS  *BOWEL PROBLEMS  UNUSUAL RASH Items with * indicate a potential emergency and should be followed up as soon as possible.  Feel free to call the clinic you have any questions or concerns. The clinic phone number is [phone].  Please show the CHEMO ALERT CARD at check-in to the Emergency Department and triage nurse.   

## 2016-06-05 NOTE — Patient Instructions (Signed)

## 2016-06-11 NOTE — Assessment & Plan Note (Signed)
Left lumpectomy 03/08/2016: IDC 2 cm, margins negative, 0/2 lymph nodes, grade 3, ER 0%, PR 0%, HER-2 negative ratio 1.31, Ki-67 40%, T1 CN 0 stage IA  Treatment plan: 1. Adjuvant chemotherapy with dose dense Adriamycin and Cytoxan 4 followed by weekly Taxol 12 started 04/17/2016 2. Followed by radiation therapy ----------------------------------------------------------------------------------------------------------------------------------------- Current treatment: Cycle 4 Taxol Echocardiogram 03/06/2016: EF 60-65% Closely monitoring for chemotherapy toxicities.  Chemotherapy toxicities:  RTC in 2 weeks 1. Nausea grade 1 2. fatigue grade 1 3. Chemotherapy-induced anemia grade 2: Monitoring closely 4. Bilateral muscle pain in the thighs: Encouraged her to drink more water  Patient's blood work was reviewed.  Return to clinic in 2weekfor cycle 6of Taxol.

## 2016-06-12 ENCOUNTER — Ambulatory Visit (HOSPITAL_BASED_OUTPATIENT_CLINIC_OR_DEPARTMENT_OTHER): Payer: BLUE CROSS/BLUE SHIELD

## 2016-06-12 ENCOUNTER — Ambulatory Visit: Payer: BLUE CROSS/BLUE SHIELD

## 2016-06-12 ENCOUNTER — Ambulatory Visit (HOSPITAL_BASED_OUTPATIENT_CLINIC_OR_DEPARTMENT_OTHER): Payer: BLUE CROSS/BLUE SHIELD | Admitting: Hematology and Oncology

## 2016-06-12 ENCOUNTER — Encounter: Payer: Self-pay | Admitting: *Deleted

## 2016-06-12 ENCOUNTER — Other Ambulatory Visit (HOSPITAL_BASED_OUTPATIENT_CLINIC_OR_DEPARTMENT_OTHER): Payer: BLUE CROSS/BLUE SHIELD

## 2016-06-12 ENCOUNTER — Encounter: Payer: Self-pay | Admitting: Hematology and Oncology

## 2016-06-12 DIAGNOSIS — C50412 Malignant neoplasm of upper-outer quadrant of left female breast: Secondary | ICD-10-CM

## 2016-06-12 DIAGNOSIS — D6481 Anemia due to antineoplastic chemotherapy: Secondary | ICD-10-CM | POA: Diagnosis not present

## 2016-06-12 DIAGNOSIS — Z171 Estrogen receptor negative status [ER-]: Principal | ICD-10-CM

## 2016-06-12 DIAGNOSIS — Z5111 Encounter for antineoplastic chemotherapy: Secondary | ICD-10-CM | POA: Diagnosis not present

## 2016-06-12 DIAGNOSIS — R530 Neoplastic (malignant) related fatigue: Secondary | ICD-10-CM | POA: Diagnosis not present

## 2016-06-12 DIAGNOSIS — M791 Myalgia: Secondary | ICD-10-CM

## 2016-06-12 DIAGNOSIS — Z95828 Presence of other vascular implants and grafts: Secondary | ICD-10-CM

## 2016-06-12 LAB — COMPREHENSIVE METABOLIC PANEL
ALBUMIN: 3.5 g/dL (ref 3.5–5.0)
ALK PHOS: 75 U/L (ref 40–150)
ALT: 34 U/L (ref 0–55)
AST: 41 U/L — AB (ref 5–34)
Anion Gap: 9 mEq/L (ref 3–11)
BILIRUBIN TOTAL: 0.29 mg/dL (ref 0.20–1.20)
BUN: 15.6 mg/dL (ref 7.0–26.0)
CALCIUM: 9.4 mg/dL (ref 8.4–10.4)
CO2: 29 mEq/L (ref 22–29)
Chloride: 103 mEq/L (ref 98–109)
Creatinine: 0.9 mg/dL (ref 0.6–1.1)
EGFR: 89 mL/min/{1.73_m2} — ABNORMAL LOW (ref >90)
Glucose: 89 mg/dl (ref 70–140)
Potassium: 3.7 mEq/L (ref 3.5–5.1)
Sodium: 141 mEq/L (ref 136–145)
TOTAL PROTEIN: 6.7 g/dL (ref 6.4–8.3)

## 2016-06-12 LAB — CBC WITH DIFFERENTIAL/PLATELET
BASO%: 0.7 % (ref 0.0–2.0)
Basophils Absolute: 0 10*3/uL (ref 0.0–0.1)
EOS ABS: 0.1 10*3/uL (ref 0.0–0.5)
EOS%: 3.1 % (ref 0.0–7.0)
HEMATOCRIT: 32.6 % — AB (ref 34.8–46.6)
HEMOGLOBIN: 10.8 g/dL — AB (ref 11.6–15.9)
LYMPH#: 0.3 10*3/uL — AB (ref 0.9–3.3)
LYMPH%: 7.8 % — ABNORMAL LOW (ref 14.0–49.7)
MCH: 28.2 pg (ref 25.1–34.0)
MCHC: 33.3 g/dL (ref 31.5–36.0)
MCV: 84.8 fL (ref 79.5–101.0)
MONO#: 0.3 10*3/uL (ref 0.1–0.9)
MONO%: 8.8 % (ref 0.0–14.0)
NEUT%: 79.6 % — ABNORMAL HIGH (ref 38.4–76.8)
NEUTROS ABS: 3 10*3/uL (ref 1.5–6.5)
Platelets: 314 10*3/uL (ref 145–400)
RBC: 3.84 10*6/uL (ref 3.70–5.45)
RDW: 25.2 % — AB (ref 11.2–14.5)
WBC: 3.8 10*3/uL — AB (ref 3.9–10.3)

## 2016-06-12 MED ORDER — PACLITAXEL CHEMO INJECTION 300 MG/50ML
60.0000 mg/m2 | Freq: Once | INTRAVENOUS | Status: AC
  Administered 2016-06-12: 156 mg via INTRAVENOUS
  Filled 2016-06-12: qty 26

## 2016-06-12 MED ORDER — FAMOTIDINE IN NACL 20-0.9 MG/50ML-% IV SOLN
INTRAVENOUS | Status: AC
  Filled 2016-06-12: qty 50

## 2016-06-12 MED ORDER — SODIUM CHLORIDE 0.9% FLUSH
10.0000 mL | INTRAVENOUS | Status: DC | PRN
  Administered 2016-06-12: 10 mL via INTRAVENOUS
  Filled 2016-06-12: qty 10

## 2016-06-12 MED ORDER — SODIUM CHLORIDE 0.9% FLUSH
10.0000 mL | INTRAVENOUS | Status: DC | PRN
  Administered 2016-06-12: 10 mL
  Filled 2016-06-12: qty 10

## 2016-06-12 MED ORDER — SODIUM CHLORIDE 0.9 % IV SOLN
Freq: Once | INTRAVENOUS | Status: AC
  Administered 2016-06-12: 11:00:00 via INTRAVENOUS

## 2016-06-12 MED ORDER — SODIUM CHLORIDE 0.9 % IV SOLN
20.0000 mg | Freq: Once | INTRAVENOUS | Status: AC
  Administered 2016-06-12: 20 mg via INTRAVENOUS
  Filled 2016-06-12: qty 2

## 2016-06-12 MED ORDER — DIPHENHYDRAMINE HCL 50 MG/ML IJ SOLN
50.0000 mg | Freq: Once | INTRAMUSCULAR | Status: AC
Start: 2016-06-12 — End: 2016-06-12
  Administered 2016-06-12: 50 mg via INTRAVENOUS

## 2016-06-12 MED ORDER — FAMOTIDINE IN NACL 20-0.9 MG/50ML-% IV SOLN
20.0000 mg | Freq: Once | INTRAVENOUS | Status: AC
  Administered 2016-06-12: 20 mg via INTRAVENOUS

## 2016-06-12 MED ORDER — HEPARIN SOD (PORK) LOCK FLUSH 100 UNIT/ML IV SOLN
500.0000 [IU] | Freq: Once | INTRAVENOUS | Status: AC | PRN
  Administered 2016-06-12: 500 [IU]
  Filled 2016-06-12: qty 5

## 2016-06-12 MED ORDER — DIPHENHYDRAMINE HCL 50 MG/ML IJ SOLN
INTRAMUSCULAR | Status: AC
  Filled 2016-06-12: qty 1

## 2016-06-12 NOTE — Patient Instructions (Signed)
[facility] Discharge Instructions for Patients Receiving Chemotherapy  Today you received the following chemotherapy agents:  Taxol  To help prevent nausea and vomiting after your treatment, we encourage you to take your nausea medication as prescribed.   If you develop nausea and vomiting that is not controlled by your nausea medication, call the clinic.   BELOW ARE SYMPTOMS THAT SHOULD BE REPORTED IMMEDIATELY:  *FEVER GREATER THAN 100.5 F  *CHILLS WITH OR WITHOUT FEVER  NAUSEA AND VOMITING THAT IS NOT CONTROLLED WITH YOUR NAUSEA MEDICATION  *UNUSUAL SHORTNESS OF BREATH  *UNUSUAL BRUISING OR BLEEDING  TENDERNESS IN MOUTH AND THROAT WITH OR WITHOUT PRESENCE OF ULCERS  *URINARY PROBLEMS  *BOWEL PROBLEMS  UNUSUAL RASH Items with * indicate a potential emergency and should be followed up as soon as possible.  Feel free to call the clinic you have any questions or concerns. The clinic phone number is [phone].  Please show the CHEMO ALERT CARD at check-in to the Emergency Department and triage nurse.   

## 2016-06-12 NOTE — Progress Notes (Signed)
Patient Care Team: [name], DO as PCP - General (Family Medicine) [name], MD as Consulting Physician (General Surgery) [name], MD as Consulting Physician (Hematology and Oncology) [name], MD as Consulting Physician (Radiation Oncology) [name], NP as Nurse Practitioner (Hematology and Oncology)  DIAGNOSIS:  Encounter Diagnosis  Name Primary?  . Malignant neoplasm of upper-outer quadrant of left breast in female, estrogen receptor negative ([place])     SUMMARY OF ONCOLOGIC HISTORY:   Malignant neoplasm of upper-outer quadrant of left breast in female, estrogen receptor negative ([place])   03/20/1995 Initial Biopsy    Head and Neck Cancer      02/09/2016 Initial Diagnosis    Left breast biopsy 2:00: IDC, left axillary lymph node biopsy negative, grade 3, ER 0%, PR 0%, Ki-67 40%, HER-2 negative ratio 1.31, screening detected left breast mass 1.8 cm, left axillary lymph node 5 mm benign, T1 CN 0 stage IA clinical stage      03/13/2016 Surgery    Left lumpectomy: IDC 2 cm, margins negative, 0/2 lymph nodes, grade 3, ER 0%, PR 0%, HER-2 negative ratio 1.31, Ki-67 40%, T1 CN 0 stage IA      04/10/2016 -  Chemotherapy    Adjuvant chemotherapy with dose dense Adriamycin and Cytoxan followed by Taxol weekly 12       CHIEF COMPLIANT:  Cycle 4 Taxol  INTERVAL HISTORY: Carla Pierce is a 46 year old with above-mentioned history left breast cancer treated with lumpectomy and is currently on adjuvant chemotherapy. Today is cycle 4 of Taxol. She complained of neuropathy in the hands especially in the fingers and toes.. She does not have any nausea vomiting. Denies any neuropathy. Fatigue is present related to chemotherapy.  REVIEW OF SYSTEMS:   Constitutional: Denies fevers, chills or abnormal weight loss Eyes: Denies blurriness of vision Ears, nose, mouth, throat, and face: Denies mucositis or sore throat Respiratory: Denies cough, dyspnea or  wheezes Cardiovascular: Denies palpitation, chest discomfort Gastrointestinal:  Denies nausea, heartburn or change in bowel habits Skin: Denies abnormal skin rashes Lymphatics: Denies new lymphadenopathy or easy bruising Neurological:neuropathy in the fingers and toes Behavioral/Psych: Mood is stable, no new changes  Extremities: No lower extremity edema Breast:  denies any pain or lumps or nodules in either breasts All other systems were reviewed with the patient and are negative.  I have reviewed the past medical history, past surgical history, social history and family history with the patient and they are unchanged from previous note.  ALLERGIES:  has No Known Allergies.  MEDICATIONS:  Current Outpatient Prescriptions  Medication Sig Dispense Refill  . albuterol (PROVENTIL HFA;VENTOLIN HFA) 108 (90 Base) MCG/ACT inhaler Inhale 2 puffs into the lungs every 6 (six) hours as needed for wheezing or shortness of breath. 1 Inhaler 2  . AMLODIPINE BESYLATE PO Take 10 mg by mouth.    . benzonatate (TESSALON) 100 MG capsule Take 1 capsule (100 mg total) by mouth 2 (two) times daily as needed for cough. 20 capsule 0  . cholecalciferol (VITAMIN D) 1000 units tablet Take 1,000 Units by mouth daily.    . fluticasone (FLONASE) 50 MCG/ACT nasal spray Place into both nostrils daily.    Marland Kitchen levothyroxine (SYNTHROID, LEVOTHROID) 200 MCG tablet Take 200 mcg by mouth daily before breakfast.    . levothyroxine (SYNTHROID, LEVOTHROID) 50 MCG tablet Take 50 mcg by mouth daily before breakfast.    . lidocaine-prilocaine (EMLA) cream Apply to affected area once 30 g 3  . loratadine (CLARITIN)  10 MG tablet Take 10 mg by mouth daily.    Marland Kitchen LORazepam (ATIVAN) 0.5 MG tablet Take 1 tablet (0.5 mg total) by mouth at bedtime. 30 tablet 0  . Naproxen Sodium (ALEVE) 220 MG CAPS Take 1 capsule by mouth. As needed for pain    . ondansetron (ZOFRAN) 8 MG tablet Take 1 tablet (8 mg total) by mouth 2 (two) times daily as  needed. Start on the third day after chemotherapy. 30 tablet 1  . oxyCODONE (OXY IR/ROXICODONE) 5 MG immediate release tablet Take 1-2 tablets (5-10 mg total) by mouth every 6 (six) hours as needed for severe pain. 20 tablet 0  . potassium chloride (MICRO-K) 10 MEQ CR capsule Take 1 capsule (10 mEq total) by mouth 2 (two) times daily. 30 capsule 1  . potassium chloride 20 MEQ/15ML (10%) SOLN Take 7.5 mLs (10 mEq total) by mouth 2 (two) times daily. 240 mL 1  . prochlorperazine (COMPAZINE) 10 MG tablet Take 1 tablet (10 mg total) by mouth every 6 (six) hours as needed (Nausea or vomiting). 30 tablet 1  . promethazine (PHENERGAN) 25 MG/ML injection Inject 0.25-0.5 mLs (6.25-12.5 mg total) into the vein every 15 (fifteen) minutes as needed for nausea. 1 mL 0  . valsartan-hydrochlorothiazide (DIOVAN-HCT) 320-25 MG tablet Take 1 tablet by mouth daily.     No current facility-administered medications for this visit.    Facility-Administered Medications Ordered in Other Visits  Medication Dose Route Frequency Provider Last Rate Last Dose  . sodium chloride flush (NS) 0.9 % injection 10 mL  10 mL Intravenous PRN [name], MD   10 mL at 04/12/16 1512    PHYSICAL EXAMINATION: ECOG PERFORMANCE STATUS: 1 - Symptomatic but completely ambulatory  Vitals:   06/12/16 0941  BP: (!) 150/86  Pulse: 82  Resp: 18  Temp: 98.1 F (36.7 C)   Filed Weights   06/12/16 0941  Weight: 297 lb 3.2 oz (134.8 kg)    GENERAL:alert, no distress and comfortable SKIN: skin color, texture, turgor are normal, no rashes or significant lesions EYES: normal, Conjunctiva are pink and non-injected, sclera clear OROPHARYNX:no exudate, no erythema and lips, buccal mucosa, and tongue normal  NECK: supple, thyroid normal size, non-tender, without nodularity LYMPH:  no palpable lymphadenopathy in the cervical, axillary or inguinal LUNGS: clear to auscultation and percussion with normal breathing effort HEART: regular rate  & rhythm and no murmurs and no lower extremity edema ABDOMEN:abdomen soft, non-tender and normal bowel sounds MUSCULOSKELETAL:no cyanosis of digits and no clubbing  NEURO: grade 2 neuropathy in fingers and toes EXTREMITIES: No lower extremity edema  LABORATORY DATA:  I have reviewed the data as listed   Chemistry      Component Value Date/Time   NA 141 06/12/2016 0843   K 3.7 06/12/2016 0843   CO2 29 06/12/2016 0843   BUN 15.6 06/12/2016 0843   CREATININE 0.9 06/12/2016 0843      Component Value Date/Time   CALCIUM 9.4 06/12/2016 0843   ALKPHOS 75 06/12/2016 0843   AST 41 (H) 06/12/2016 0843   ALT 34 06/12/2016 0843   BILITOT 0.29 06/12/2016 0843       Lab Results  Component Value Date   WBC 3.8 (L) 06/12/2016   HGB 10.8 (L) 06/12/2016   HCT 32.6 (L) 06/12/2016   MCV 84.8 06/12/2016   PLT 314 06/12/2016   NEUTROABS 3.0 06/12/2016    ASSESSMENT & PLAN:  Malignant neoplasm of upper-outer quadrant of left breast in female, estrogen  receptor negative ([place]) Left lumpectomy 03/08/2016: IDC 2 cm, margins negative, 0/2 lymph nodes, grade 3, ER 0%, PR 0%, HER-2 negative ratio 1.31, Ki-67 40%, T1 CN 0 stage IA  Treatment plan: 1. Adjuvant chemotherapy with dose dense Adriamycin and Cytoxan 4 followed by weekly Taxol 12 started 04/17/2016 2. Followed by radiation therapy ----------------------------------------------------------------------------------------------------------------------------------------- Current treatment: Cycle 4 Taxol Closely monitoring for chemotherapy toxicities.  Chemotherapy toxicities: 1. Nausea grade 1 2. fatigue grade 1 3. Chemotherapy-induced anemia grade 2: Monitoring closely 4. Bilateral muscle pain in the thighs: Encouraged her to drink more water 5. 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 Patient's blood work was reviewed.  Return to clinic in 2weekfor cycle 6of Taxol.  I spent 25 minutes talking to the patient of which more than half was spent in counseling and coordination of care.  No orders of the defined types were placed in this encounter.  The patient has a good understanding of the overall plan. she agrees with it. she will call with any problems that may develop before the next visit here.   Rulon Eisenmenger, MD 06/12/16

## 2016-06-19 ENCOUNTER — Other Ambulatory Visit (HOSPITAL_BASED_OUTPATIENT_CLINIC_OR_DEPARTMENT_OTHER): Payer: BLUE CROSS/BLUE SHIELD

## 2016-06-19 ENCOUNTER — Ambulatory Visit (HOSPITAL_BASED_OUTPATIENT_CLINIC_OR_DEPARTMENT_OTHER): Payer: BLUE CROSS/BLUE SHIELD

## 2016-06-19 ENCOUNTER — Ambulatory Visit: Payer: BLUE CROSS/BLUE SHIELD

## 2016-06-19 VITALS — BP 126/89 | HR 81 | Temp 98.6°F | Resp 18

## 2016-06-19 DIAGNOSIS — Z171 Estrogen receptor negative status [ER-]: Principal | ICD-10-CM

## 2016-06-19 DIAGNOSIS — Z5111 Encounter for antineoplastic chemotherapy: Secondary | ICD-10-CM

## 2016-06-19 DIAGNOSIS — C50412 Malignant neoplasm of upper-outer quadrant of left female breast: Secondary | ICD-10-CM

## 2016-06-19 DIAGNOSIS — Z95828 Presence of other vascular implants and grafts: Secondary | ICD-10-CM

## 2016-06-19 LAB — COMPREHENSIVE METABOLIC PANEL
ALT: 28 U/L (ref 0–55)
AST: 33 U/L (ref 5–34)
Albumin: 3.4 g/dL — ABNORMAL LOW (ref 3.5–5.0)
Alkaline Phosphatase: 81 U/L (ref 40–150)
Anion Gap: 7 mEq/L (ref 3–11)
BUN: 18 mg/dL (ref 7.0–26.0)
CHLORIDE: 105 meq/L (ref 98–109)
CO2: 30 meq/L — AB (ref 22–29)
Calcium: 9.3 mg/dL (ref 8.4–10.4)
Creatinine: 0.8 mg/dL (ref 0.6–1.1)
EGFR: >90 mL/min/{1.73_m2} (ref >90)
GLUCOSE: 88 mg/dL (ref 70–140)
POTASSIUM: 3.8 meq/L (ref 3.5–5.1)
SODIUM: 142 meq/L (ref 136–145)
Total Bilirubin: 0.3 mg/dL (ref 0.20–1.20)
Total Protein: 6.5 g/dL (ref 6.4–8.3)

## 2016-06-19 LAB — CBC WITH DIFFERENTIAL/PLATELET
BASO%: 0.7 % (ref 0.0–2.0)
BASOS ABS: 0 10*3/uL (ref 0.0–0.1)
EOS%: 2.5 % (ref 0.0–7.0)
Eosinophils Absolute: 0.1 10*3/uL (ref 0.0–0.5)
HCT: 31.3 % — ABNORMAL LOW (ref 34.8–46.6)
HGB: 10.4 g/dL — ABNORMAL LOW (ref 11.6–15.9)
LYMPH%: 5.7 % — ABNORMAL LOW (ref 14.0–49.7)
MCH: 28.7 pg (ref 25.1–34.0)
MCHC: 33.3 g/dL (ref 31.5–36.0)
MCV: 86.1 fL (ref 79.5–101.0)
MONO#: 0.3 10*3/uL (ref 0.1–0.9)
MONO%: 7.4 % (ref 0.0–14.0)
NEUT#: 3.9 10*3/uL (ref 1.5–6.5)
NEUT%: 83.7 % — AB (ref 38.4–76.8)
Platelets: 262 10*3/uL (ref 145–400)
RBC: 3.63 10*6/uL — ABNORMAL LOW (ref 3.70–5.45)
RDW: 26.2 % — AB (ref 11.2–14.5)
WBC: 4.6 10*3/uL (ref 3.9–10.3)
lymph#: 0.3 10*3/uL — ABNORMAL LOW (ref 0.9–3.3)

## 2016-06-19 MED ORDER — DEXAMETHASONE SODIUM PHOSPHATE 100 MG/10ML IJ SOLN
20.0000 mg | Freq: Once | INTRAMUSCULAR | Status: AC
  Administered 2016-06-19: 20 mg via INTRAVENOUS
  Filled 2016-06-19: qty 2

## 2016-06-19 MED ORDER — DIPHENHYDRAMINE HCL 50 MG/ML IJ SOLN
50.0000 mg | Freq: Once | INTRAMUSCULAR | Status: AC
  Administered 2016-06-19: 50 mg via INTRAVENOUS

## 2016-06-19 MED ORDER — SODIUM CHLORIDE 0.9 % IV SOLN
Freq: Once | INTRAVENOUS | Status: AC
  Administered 2016-06-19: 10:00:00 via INTRAVENOUS

## 2016-06-19 MED ORDER — SODIUM CHLORIDE 0.9% FLUSH
10.0000 mL | INTRAVENOUS | Status: DC | PRN
  Administered 2016-06-19: 10 mL
  Filled 2016-06-19: qty 10

## 2016-06-19 MED ORDER — FAMOTIDINE IN NACL 20-0.9 MG/50ML-% IV SOLN
INTRAVENOUS | Status: AC
  Filled 2016-06-19: qty 50

## 2016-06-19 MED ORDER — SODIUM CHLORIDE 0.9% FLUSH
10.0000 mL | INTRAVENOUS | Status: DC | PRN
  Administered 2016-06-19: 10 mL via INTRAVENOUS
  Filled 2016-06-19: qty 10

## 2016-06-19 MED ORDER — DIPHENHYDRAMINE HCL 50 MG/ML IJ SOLN
INTRAMUSCULAR | Status: AC
  Filled 2016-06-19: qty 1

## 2016-06-19 MED ORDER — HEPARIN SOD (PORK) LOCK FLUSH 100 UNIT/ML IV SOLN
500.0000 [IU] | Freq: Once | INTRAVENOUS | Status: AC | PRN
  Administered 2016-06-19: 500 [IU]
  Filled 2016-06-19: qty 5

## 2016-06-19 MED ORDER — PACLITAXEL CHEMO INJECTION 300 MG/50ML
60.0000 mg/m2 | Freq: Once | INTRAVENOUS | Status: AC
  Administered 2016-06-19: 156 mg via INTRAVENOUS
  Filled 2016-06-19: qty 26

## 2016-06-19 MED ORDER — FAMOTIDINE IN NACL 20-0.9 MG/50ML-% IV SOLN
20.0000 mg | Freq: Once | INTRAVENOUS | Status: AC
  Administered 2016-06-19: 20 mg via INTRAVENOUS

## 2016-06-19 NOTE — Patient Instructions (Signed)
[facility] Discharge Instructions for Patients Receiving Chemotherapy  Today you received the following chemotherapy agents:  Taxol  To help prevent nausea and vomiting after your treatment, we encourage you to take your nausea medication as prescribed.   If you develop nausea and vomiting that is not controlled by your nausea medication, call the clinic.   BELOW ARE SYMPTOMS THAT SHOULD BE REPORTED IMMEDIATELY:  *FEVER GREATER THAN 100.5 F  *CHILLS WITH OR WITHOUT FEVER  NAUSEA AND VOMITING THAT IS NOT CONTROLLED WITH YOUR NAUSEA MEDICATION  *UNUSUAL SHORTNESS OF BREATH  *UNUSUAL BRUISING OR BLEEDING  TENDERNESS IN MOUTH AND THROAT WITH OR WITHOUT PRESENCE OF ULCERS  *URINARY PROBLEMS  *BOWEL PROBLEMS  UNUSUAL RASH Items with * indicate a potential emergency and should be followed up as soon as possible.  Feel free to call the clinic you have any questions or concerns. The clinic phone number is [phone].  Please show the CHEMO ALERT CARD at check-in to the Emergency Department and triage nurse.   

## 2016-06-26 ENCOUNTER — Encounter: Payer: Self-pay | Admitting: *Deleted

## 2016-06-26 ENCOUNTER — Ambulatory Visit (HOSPITAL_BASED_OUTPATIENT_CLINIC_OR_DEPARTMENT_OTHER): Payer: BLUE CROSS/BLUE SHIELD | Admitting: Hematology and Oncology

## 2016-06-26 ENCOUNTER — Other Ambulatory Visit (HOSPITAL_BASED_OUTPATIENT_CLINIC_OR_DEPARTMENT_OTHER): Payer: BLUE CROSS/BLUE SHIELD

## 2016-06-26 ENCOUNTER — Ambulatory Visit: Payer: BLUE CROSS/BLUE SHIELD

## 2016-06-26 ENCOUNTER — Ambulatory Visit (HOSPITAL_BASED_OUTPATIENT_CLINIC_OR_DEPARTMENT_OTHER): Payer: BLUE CROSS/BLUE SHIELD

## 2016-06-26 DIAGNOSIS — R110 Nausea: Secondary | ICD-10-CM

## 2016-06-26 DIAGNOSIS — Z95828 Presence of other vascular implants and grafts: Secondary | ICD-10-CM

## 2016-06-26 DIAGNOSIS — Z5111 Encounter for antineoplastic chemotherapy: Secondary | ICD-10-CM | POA: Diagnosis not present

## 2016-06-26 DIAGNOSIS — G620 Drug-induced polyneuropathy: Secondary | ICD-10-CM | POA: Diagnosis not present

## 2016-06-26 DIAGNOSIS — Z171 Estrogen receptor negative status [ER-]: Secondary | ICD-10-CM | POA: Diagnosis not present

## 2016-06-26 DIAGNOSIS — C50412 Malignant neoplasm of upper-outer quadrant of left female breast: Secondary | ICD-10-CM | POA: Diagnosis not present

## 2016-06-26 DIAGNOSIS — R530 Neoplastic (malignant) related fatigue: Secondary | ICD-10-CM

## 2016-06-26 DIAGNOSIS — M791 Myalgia: Secondary | ICD-10-CM

## 2016-06-26 DIAGNOSIS — D6481 Anemia due to antineoplastic chemotherapy: Secondary | ICD-10-CM | POA: Diagnosis not present

## 2016-06-26 LAB — CBC WITH DIFFERENTIAL/PLATELET
BASO%: 0.4 % (ref 0.0–2.0)
BASOS ABS: 0 10*3/uL (ref 0.0–0.1)
EOS ABS: 0.1 10*3/uL (ref 0.0–0.5)
EOS%: 2.4 % (ref 0.0–7.0)
HEMATOCRIT: 33 % — AB (ref 34.8–46.6)
HGB: 10.7 g/dL — ABNORMAL LOW (ref 11.6–15.9)
LYMPH#: 0.5 10*3/uL — AB (ref 0.9–3.3)
LYMPH%: 10.7 % — ABNORMAL LOW (ref 14.0–49.7)
MCH: 28.6 pg (ref 25.1–34.0)
MCHC: 32.4 g/dL (ref 31.5–36.0)
MCV: 88.2 fL (ref 79.5–101.0)
MONO#: 0.1 10*3/uL (ref 0.1–0.9)
MONO%: 1.6 % (ref 0.0–14.0)
NEUT#: 4.2 10*3/uL (ref 1.5–6.5)
NEUT%: 84.9 % — AB (ref 38.4–76.8)
PLATELETS: 218 10*3/uL (ref 145–400)
RBC: 3.74 10*6/uL (ref 3.70–5.45)
RDW: 23.7 % — ABNORMAL HIGH (ref 11.2–14.5)
WBC: 5 10*3/uL (ref 3.9–10.3)

## 2016-06-26 LAB — COMPREHENSIVE METABOLIC PANEL
ALT: 30 U/L (ref 0–55)
AST: 37 U/L — ABNORMAL HIGH (ref 5–34)
Albumin: 3.4 g/dL — ABNORMAL LOW (ref 3.5–5.0)
Alkaline Phosphatase: 78 U/L (ref 40–150)
Anion Gap: 10 mEq/L (ref 3–11)
BUN: 16.4 mg/dL (ref 7.0–26.0)
CALCIUM: 9.5 mg/dL (ref 8.4–10.4)
CHLORIDE: 103 meq/L (ref 98–109)
CO2: 28 mEq/L (ref 22–29)
CREATININE: 0.8 mg/dL (ref 0.6–1.1)
EGFR: >90 mL/min/{1.73_m2} (ref >90)
Glucose: 113 mg/dl (ref 70–140)
POTASSIUM: 3.2 meq/L — AB (ref 3.5–5.1)
Sodium: 140 mEq/L (ref 136–145)
Total Bilirubin: 0.32 mg/dL (ref 0.20–1.20)
Total Protein: 6.6 g/dL (ref 6.4–8.3)

## 2016-06-26 MED ORDER — PACLITAXEL CHEMO INJECTION 300 MG/50ML
60.0000 mg/m2 | Freq: Once | INTRAVENOUS | Status: AC
  Administered 2016-06-26: 156 mg via INTRAVENOUS
  Filled 2016-06-26: qty 26

## 2016-06-26 MED ORDER — DIPHENHYDRAMINE HCL 50 MG/ML IJ SOLN
50.0000 mg | Freq: Once | INTRAMUSCULAR | Status: AC
  Administered 2016-06-26: 50 mg via INTRAVENOUS

## 2016-06-26 MED ORDER — SODIUM CHLORIDE 0.9% FLUSH
10.0000 mL | INTRAVENOUS | Status: DC | PRN
  Administered 2016-06-26: 10 mL via INTRAVENOUS
  Filled 2016-06-26: qty 10

## 2016-06-26 MED ORDER — SODIUM CHLORIDE 0.9 % IV SOLN
Freq: Once | INTRAVENOUS | Status: AC
  Administered 2016-06-26: 11:00:00 via INTRAVENOUS

## 2016-06-26 MED ORDER — DIPHENHYDRAMINE HCL 50 MG/ML IJ SOLN
INTRAMUSCULAR | Status: AC
  Filled 2016-06-26: qty 1

## 2016-06-26 MED ORDER — HEPARIN SOD (PORK) LOCK FLUSH 100 UNIT/ML IV SOLN
500.0000 [IU] | Freq: Once | INTRAVENOUS | Status: AC | PRN
  Administered 2016-06-26: 500 [IU]
  Filled 2016-06-26: qty 5

## 2016-06-26 MED ORDER — HEPARIN SOD (PORK) LOCK FLUSH 100 UNIT/ML IV SOLN
500.0000 [IU] | Freq: Once | INTRAVENOUS | Status: DC | PRN
  Filled 2016-06-26: qty 5

## 2016-06-26 MED ORDER — FAMOTIDINE IN NACL 20-0.9 MG/50ML-% IV SOLN
INTRAVENOUS | Status: AC
  Filled 2016-06-26: qty 50

## 2016-06-26 MED ORDER — SODIUM CHLORIDE 0.9% FLUSH
10.0000 mL | INTRAVENOUS | Status: DC | PRN
  Administered 2016-06-26: 10 mL
  Filled 2016-06-26: qty 10

## 2016-06-26 MED ORDER — FAMOTIDINE IN NACL 20-0.9 MG/50ML-% IV SOLN
20.0000 mg | Freq: Once | INTRAVENOUS | Status: AC
  Administered 2016-06-26: 20 mg via INTRAVENOUS

## 2016-06-26 MED ORDER — SODIUM CHLORIDE 0.9 % IV SOLN
20.0000 mg | Freq: Once | INTRAVENOUS | Status: AC
  Administered 2016-06-26: 20 mg via INTRAVENOUS
  Filled 2016-06-26: qty 2

## 2016-06-26 NOTE — Patient Instructions (Signed)
[facility] Discharge Instructions for Patients Receiving Chemotherapy  Today you received the following chemotherapy agents Taxol   To help prevent nausea and vomiting after your treatment, we encourage you to take your nausea medication as directed.   If you develop nausea and vomiting that is not controlled by your nausea medication, call the clinic.   BELOW ARE SYMPTOMS THAT SHOULD BE REPORTED IMMEDIATELY:  *FEVER GREATER THAN 100.5 F  *CHILLS WITH OR WITHOUT FEVER  NAUSEA AND VOMITING THAT IS NOT CONTROLLED WITH YOUR NAUSEA MEDICATION  *UNUSUAL SHORTNESS OF BREATH  *UNUSUAL BRUISING OR BLEEDING  TENDERNESS IN MOUTH AND THROAT WITH OR WITHOUT PRESENCE OF ULCERS  *URINARY PROBLEMS  *BOWEL PROBLEMS  UNUSUAL RASH Items with * indicate a potential emergency and should be followed up as soon as possible.  Feel free to call the clinic you have any questions or concerns. The clinic phone number is [phone].  Please show the CHEMO ALERT CARD at check-in to the Emergency Department and triage nurse.   

## 2016-06-26 NOTE — Assessment & Plan Note (Signed)
Left lumpectomy 03/08/2016: IDC 2 cm, margins negative, 0/2 lymph nodes, grade 3, ER 0%, PR 0%, HER-2 negative ratio 1.31, Ki-67 40%, T1 CN 0 stage IA  Treatment plan: 1. Adjuvant chemotherapy with dose dense Adriamycin and Cytoxan 4 followed by weekly Taxol 12 started 04/17/2016 2. Followed by radiation therapy ----------------------------------------------------------------------------------------------------------------------------------------- Current treatment: Cycle 6 Taxol Closely monitoring for chemotherapy toxicities.  Chemotherapy toxicities: 1. Nausea grade 1 2. fatigue grade 1 3. Chemotherapy-induced anemia grade 2: Monitoring closely 4. Bilateral muscle pain in the thighs: Encouraged her to drink more water 5. 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 Patient's blood work was reviewed.  Return to clinic in 2weekfor cycle 8of Taxol.

## 2016-06-26 NOTE — Progress Notes (Signed)
Patient Care Team: [name], DO as PCP - General (Family Medicine) [name], MD as Consulting Physician (General Surgery) [name], MD as Consulting Physician (Hematology and Oncology) [name], MD as Consulting Physician (Radiation Oncology) [name], NP as Nurse Practitioner (Hematology and Oncology)  DIAGNOSIS:  Encounter Diagnosis  Name Primary?  . Malignant neoplasm of upper-outer quadrant of left breast in female, estrogen receptor negative ([place])     SUMMARY OF ONCOLOGIC HISTORY:   Malignant neoplasm of upper-outer quadrant of left breast in female, estrogen receptor negative ([place])   03/20/1995 Initial Biopsy    Head and Neck Cancer      02/09/2016 Initial Diagnosis    Left breast biopsy 2:00: IDC, left axillary lymph node biopsy negative, grade 3, ER 0%, PR 0%, Ki-67 40%, HER-2 negative ratio 1.31, screening detected left breast mass 1.8 cm, left axillary lymph node 5 mm benign, T1 CN 0 stage IA clinical stage      03/13/2016 Surgery    Left lumpectomy: IDC 2 cm, margins negative, 0/2 lymph nodes, grade 3, ER 0%, PR 0%, HER-2 negative ratio 1.31, Ki-67 40%, T1 CN 0 stage IA      04/10/2016 -  Chemotherapy    Adjuvant chemotherapy with dose dense Adriamycin and Cytoxan followed by Taxol weekly 12       CHIEF COMPLIANT: cycle 6 Taxol  INTERVAL HISTORY: Carla Pierce is a 46 year old with above-mentioned history left breast cancer who is currently undergoing adjuvant chemotherapy. Today is cycle 6 of Taxol. She complains of neuropathy in the fingers. Because this will reduce the dosage of Taxol. Denies any nausea vomiting.  REVIEW OF SYSTEMS:   Constitutional: Denies fevers, chills or abnormal weight loss Eyes: Denies blurriness of vision Ears, nose, mouth, throat, and face: Denies mucositis or sore throat Respiratory: Denies cough, dyspnea or wheezes Cardiovascular: Denies palpitation, chest discomfort Gastrointestinal:   Denies nausea, heartburn or change in bowel habits Skin: Denies abnormal skin rashes Lymphatics: Denies new lymphadenopathy or easy bruising Neurological:Tingling and numbness of fingers and toes improved Behavioral/Psych: Mood is stable, no new changes  Extremities: No lower extremity edema  All other systems were reviewed with the patient and are negative.  I have reviewed the past medical history, past surgical history, social history and family history with the patient and they are unchanged from previous note.  ALLERGIES:  has No Known Allergies.  MEDICATIONS:  Current Outpatient Prescriptions  Medication Sig Dispense Refill  . albuterol (PROVENTIL HFA;VENTOLIN HFA) 108 (90 Base) MCG/ACT inhaler Inhale 2 puffs into the lungs every 6 (six) hours as needed for wheezing or shortness of breath. 1 Inhaler 2  . AMLODIPINE BESYLATE PO Take 10 mg by mouth.    . benzonatate (TESSALON) 100 MG capsule Take 1 capsule (100 mg total) by mouth 2 (two) times daily as needed for cough. 20 capsule 0  . cholecalciferol (VITAMIN D) 1000 units tablet Take 1,000 Units by mouth daily.    . fluticasone (FLONASE) 50 MCG/ACT nasal spray Place into both nostrils daily.    Marland Kitchen levothyroxine (SYNTHROID, LEVOTHROID) 200 MCG tablet Take 200 mcg by mouth daily before breakfast.    . levothyroxine (SYNTHROID, LEVOTHROID) 50 MCG tablet Take 50 mcg by mouth daily before breakfast.    . lidocaine-prilocaine (EMLA) cream Apply to affected area once 30 g 3  . loratadine (CLARITIN) 10 MG tablet Take 10 mg by mouth daily.    Marland Kitchen LORazepam (ATIVAN) 0.5 MG tablet Take 1 tablet (0.5 mg  total) by mouth at bedtime. 30 tablet 0  . Naproxen Sodium (ALEVE) 220 MG CAPS Take 1 capsule by mouth. As needed for pain    . ondansetron (ZOFRAN) 8 MG tablet Take 1 tablet (8 mg total) by mouth 2 (two) times daily as needed. Start on the third day after chemotherapy. 30 tablet 1  . oxyCODONE (OXY IR/ROXICODONE) 5 MG immediate release tablet Take  1-2 tablets (5-10 mg total) by mouth every 6 (six) hours as needed for severe pain. 20 tablet 0  . potassium chloride (MICRO-K) 10 MEQ CR capsule Take 1 capsule (10 mEq total) by mouth 2 (two) times daily. 30 capsule 1  . potassium chloride 20 MEQ/15ML (10%) SOLN Take 7.5 mLs (10 mEq total) by mouth 2 (two) times daily. 240 mL 1  . prochlorperazine (COMPAZINE) 10 MG tablet Take 1 tablet (10 mg total) by mouth every 6 (six) hours as needed (Nausea or vomiting). 30 tablet 1  . promethazine (PHENERGAN) 25 MG/ML injection Inject 0.25-0.5 mLs (6.25-12.5 mg total) into the vein every 15 (fifteen) minutes as needed for nausea. 1 mL 0  . valsartan-hydrochlorothiazide (DIOVAN-HCT) 320-25 MG tablet Take 1 tablet by mouth daily.     No current facility-administered medications for this visit.    Facility-Administered Medications Ordered in Other Visits  Medication Dose Route Frequency Provider Last Rate Last Dose  . sodium chloride flush (NS) 0.9 % injection 10 mL  10 mL Intravenous PRN [name], MD   10 mL at 04/12/16 1512    PHYSICAL EXAMINATION: ECOG PERFORMANCE STATUS: 1 - Symptomatic but completely ambulatory  Vitals:   06/26/16 1054  BP: (!) 169/112  Pulse: 90  Resp: 18  Temp: 98.2 F (36.8 C)   Filed Weights   06/26/16 1054  Weight: (!) 301 lb 9.6 oz (136.8 kg)    GENERAL:alert, no distress and comfortable SKIN: skin color, texture, turgor are normal, no rashes or significant lesions EYES: normal, Conjunctiva are pink and non-injected, sclera clear OROPHARYNX:no exudate, no erythema and lips, buccal mucosa, and tongue normal  NECK: supple, thyroid normal size, non-tender, without nodularity LYMPH:  no palpable lymphadenopathy in the cervical, axillary or inguinal LUNGS: clear to auscultation and percussion with normal breathing effort HEART: regular rate & rhythm and no murmurs and no lower extremity edema ABDOMEN:abdomen soft, non-tender and normal bowel  sounds MUSCULOSKELETAL:no cyanosis of digits and no clubbing  NEURO: alert & oriented x 3 with fluent speech, neuropathy in hands and feet grade 1 EXTREMITIES: No lower extremity edema  LABORATORY DATA:  I have reviewed the data as listed   Chemistry      Component Value Date/Time   NA 140 06/26/2016 0925   K 3.2 (L) 06/26/2016 0925   CO2 28 06/26/2016 0925   BUN 16.4 06/26/2016 0925   CREATININE 0.8 06/26/2016 0925      Component Value Date/Time   CALCIUM 9.5 06/26/2016 0925   ALKPHOS 78 06/26/2016 0925   AST 37 (H) 06/26/2016 0925   ALT 30 06/26/2016 0925   BILITOT 0.32 06/26/2016 0925       Lab Results  Component Value Date   WBC 5.0 06/26/2016   HGB 10.7 (L) 06/26/2016   HCT 33.0 (L) 06/26/2016   MCV 88.2 06/26/2016   PLT 218 06/26/2016   NEUTROABS 4.2 06/26/2016    ASSESSMENT & PLAN:  Malignant neoplasm of upper-outer quadrant of left breast in female, estrogen receptor negative ([place]) Left lumpectomy 03/08/2016: IDC 2 cm, margins negative, 0/2 lymph nodes,  grade 3, ER 0%, PR 0%, HER-2 negative ratio 1.31, Ki-67 40%, T1 CN 0 stage IA  Treatment plan: 1. Adjuvant chemotherapy with dose dense Adriamycin and Cytoxan 4 followed by weekly Taxol 12 started 04/17/2016 2. Followed by radiation therapy ----------------------------------------------------------------------------------------------------------------------------------------- Current treatment: Cycle 6 Taxol Closely monitoring for chemotherapy toxicities.  Chemotherapy toxicities: 1. Nausea grade 1 2. fatigue grade 1 3. Chemotherapy-induced anemia grade 2: Monitoring closely 4. Bilateral muscle pain in the thighs: Encouraged her to drink more water 5. 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 Symptoms are stable Patient's blood work was reviewed.  Return to clinic in 2weekfor cycle 8of Taxol.   I spent 25 minutes talking to  the patient of which more than half was spent in counseling and coordination of care.  No orders of the defined types were placed in this encounter.  The patient has a good understanding of the overall plan. she agrees with it. she will call with any problems that may develop before the next visit here.   Rulon Eisenmenger, MD 06/26/16

## 2016-06-26 NOTE — Patient Instructions (Signed)

## 2016-07-03 ENCOUNTER — Ambulatory Visit (HOSPITAL_BASED_OUTPATIENT_CLINIC_OR_DEPARTMENT_OTHER): Payer: BLUE CROSS/BLUE SHIELD

## 2016-07-03 ENCOUNTER — Other Ambulatory Visit (HOSPITAL_BASED_OUTPATIENT_CLINIC_OR_DEPARTMENT_OTHER): Payer: BLUE CROSS/BLUE SHIELD

## 2016-07-03 VITALS — BP 123/65 | HR 84 | Temp 98.6°F | Resp 20

## 2016-07-03 DIAGNOSIS — Z5111 Encounter for antineoplastic chemotherapy: Secondary | ICD-10-CM | POA: Diagnosis not present

## 2016-07-03 DIAGNOSIS — C50412 Malignant neoplasm of upper-outer quadrant of left female breast: Secondary | ICD-10-CM

## 2016-07-03 DIAGNOSIS — Z171 Estrogen receptor negative status [ER-]: Principal | ICD-10-CM

## 2016-07-03 LAB — COMPREHENSIVE METABOLIC PANEL
ALT: 32 U/L (ref 0–55)
AST: 40 U/L — AB (ref 5–34)
Albumin: 3.5 g/dL (ref 3.5–5.0)
Alkaline Phosphatase: 76 U/L (ref 40–150)
Anion Gap: 9 mEq/L (ref 3–11)
BUN: 14.7 mg/dL (ref 7.0–26.0)
CO2: 29 meq/L (ref 22–29)
CREATININE: 0.9 mg/dL (ref 0.6–1.1)
Calcium: 9.3 mg/dL (ref 8.4–10.4)
Chloride: 106 mEq/L (ref 98–109)
GLUCOSE: 101 mg/dL (ref 70–140)
Potassium: 3.6 mEq/L (ref 3.5–5.1)
SODIUM: 144 meq/L (ref 136–145)
TOTAL PROTEIN: 6.6 g/dL (ref 6.4–8.3)
Total Bilirubin: 0.28 mg/dL (ref 0.20–1.20)

## 2016-07-03 LAB — CBC WITH DIFFERENTIAL/PLATELET
BASO%: 0.4 % (ref 0.0–2.0)
Basophils Absolute: 0 10*3/uL (ref 0.0–0.1)
EOS%: 1.8 % (ref 0.0–7.0)
Eosinophils Absolute: 0.1 10*3/uL (ref 0.0–0.5)
HCT: 32.8 % — ABNORMAL LOW (ref 34.8–46.6)
HGB: 11 g/dL — ABNORMAL LOW (ref 11.6–15.9)
LYMPH%: 8.7 % — AB (ref 14.0–49.7)
MCH: 29.5 pg (ref 25.1–34.0)
MCHC: 33.5 g/dL (ref 31.5–36.0)
MCV: 88.1 fL (ref 79.5–101.0)
MONO#: 0.3 10*3/uL (ref 0.1–0.9)
MONO%: 7.1 % (ref 0.0–14.0)
NEUT#: 3.2 10*3/uL (ref 1.5–6.5)
NEUT%: 82 % — ABNORMAL HIGH (ref 38.4–76.8)
Platelets: 267 10*3/uL (ref 145–400)
RBC: 3.72 10*6/uL (ref 3.70–5.45)
RDW: 26.4 % — ABNORMAL HIGH (ref 11.2–14.5)
WBC: 3.9 10*3/uL (ref 3.9–10.3)
lymph#: 0.3 10*3/uL — ABNORMAL LOW (ref 0.9–3.3)

## 2016-07-03 MED ORDER — SODIUM CHLORIDE 0.9 % IV SOLN
Freq: Once | INTRAVENOUS | Status: AC
  Administered 2016-07-03: 09:00:00 via INTRAVENOUS

## 2016-07-03 MED ORDER — DIPHENHYDRAMINE HCL 50 MG/ML IJ SOLN
50.0000 mg | Freq: Once | INTRAMUSCULAR | Status: AC
  Administered 2016-07-03: 50 mg via INTRAVENOUS

## 2016-07-03 MED ORDER — DIPHENHYDRAMINE HCL 50 MG/ML IJ SOLN
INTRAMUSCULAR | Status: AC
  Filled 2016-07-03: qty 1

## 2016-07-03 MED ORDER — SODIUM CHLORIDE 0.9 % IV SOLN
20.0000 mg | Freq: Once | INTRAVENOUS | Status: AC
  Administered 2016-07-03: 20 mg via INTRAVENOUS
  Filled 2016-07-03: qty 2

## 2016-07-03 MED ORDER — HEPARIN SOD (PORK) LOCK FLUSH 100 UNIT/ML IV SOLN
500.0000 [IU] | Freq: Once | INTRAVENOUS | Status: AC | PRN
  Administered 2016-07-03: 500 [IU]
  Filled 2016-07-03: qty 5

## 2016-07-03 MED ORDER — FAMOTIDINE IN NACL 20-0.9 MG/50ML-% IV SOLN
20.0000 mg | Freq: Once | INTRAVENOUS | Status: AC
Start: 2016-07-03 — End: 2016-07-03
  Administered 2016-07-03: 20 mg via INTRAVENOUS

## 2016-07-03 MED ORDER — PACLITAXEL CHEMO INJECTION 300 MG/50ML
60.0000 mg/m2 | Freq: Once | INTRAVENOUS | Status: AC
  Administered 2016-07-03: 156 mg via INTRAVENOUS
  Filled 2016-07-03: qty 26

## 2016-07-03 MED ORDER — SODIUM CHLORIDE 0.9% FLUSH
10.0000 mL | INTRAVENOUS | Status: DC | PRN
  Administered 2016-07-03: 10 mL
  Filled 2016-07-03: qty 10

## 2016-07-03 MED ORDER — FAMOTIDINE IN NACL 20-0.9 MG/50ML-% IV SOLN
INTRAVENOUS | Status: AC
  Filled 2016-07-03: qty 50

## 2016-07-03 NOTE — Patient Instructions (Signed)
[facility] Discharge Instructions for Patients Receiving Chemotherapy  Today you received the following chemotherapy agents Taxol   To help prevent nausea and vomiting after your treatment, we encourage you to take your nausea medication as directed.   If you develop nausea and vomiting that is not controlled by your nausea medication, call the clinic.   BELOW ARE SYMPTOMS THAT SHOULD BE REPORTED IMMEDIATELY:  *FEVER GREATER THAN 100.5 F  *CHILLS WITH OR WITHOUT FEVER  NAUSEA AND VOMITING THAT IS NOT CONTROLLED WITH YOUR NAUSEA MEDICATION  *UNUSUAL SHORTNESS OF BREATH  *UNUSUAL BRUISING OR BLEEDING  TENDERNESS IN MOUTH AND THROAT WITH OR WITHOUT PRESENCE OF ULCERS  *URINARY PROBLEMS  *BOWEL PROBLEMS  UNUSUAL RASH Items with * indicate a potential emergency and should be followed up as soon as possible.  Feel free to call the clinic you have any questions or concerns. The clinic phone number is [phone].  Please show the CHEMO ALERT CARD at check-in to the Emergency Department and triage nurse.   

## 2016-07-09 ENCOUNTER — Encounter: Payer: Self-pay | Admitting: Hematology and Oncology

## 2016-07-10 ENCOUNTER — Other Ambulatory Visit (HOSPITAL_BASED_OUTPATIENT_CLINIC_OR_DEPARTMENT_OTHER): Payer: BLUE CROSS/BLUE SHIELD

## 2016-07-10 ENCOUNTER — Ambulatory Visit: Payer: BLUE CROSS/BLUE SHIELD

## 2016-07-10 ENCOUNTER — Encounter: Payer: Self-pay | Admitting: *Deleted

## 2016-07-10 ENCOUNTER — Ambulatory Visit (HOSPITAL_BASED_OUTPATIENT_CLINIC_OR_DEPARTMENT_OTHER): Payer: BLUE CROSS/BLUE SHIELD

## 2016-07-10 ENCOUNTER — Ambulatory Visit (HOSPITAL_BASED_OUTPATIENT_CLINIC_OR_DEPARTMENT_OTHER): Payer: BLUE CROSS/BLUE SHIELD | Admitting: Hematology and Oncology

## 2016-07-10 ENCOUNTER — Encounter: Payer: Self-pay | Admitting: Hematology and Oncology

## 2016-07-10 DIAGNOSIS — D6481 Anemia due to antineoplastic chemotherapy: Secondary | ICD-10-CM | POA: Diagnosis not present

## 2016-07-10 DIAGNOSIS — G620 Drug-induced polyneuropathy: Secondary | ICD-10-CM

## 2016-07-10 DIAGNOSIS — Z171 Estrogen receptor negative status [ER-]: Secondary | ICD-10-CM | POA: Diagnosis not present

## 2016-07-10 DIAGNOSIS — M791 Myalgia: Secondary | ICD-10-CM

## 2016-07-10 DIAGNOSIS — Z95828 Presence of other vascular implants and grafts: Secondary | ICD-10-CM

## 2016-07-10 DIAGNOSIS — C50412 Malignant neoplasm of upper-outer quadrant of left female breast: Secondary | ICD-10-CM

## 2016-07-10 DIAGNOSIS — R110 Nausea: Secondary | ICD-10-CM

## 2016-07-10 LAB — COMPREHENSIVE METABOLIC PANEL
ALT: 33 U/L (ref 0–55)
ANION GAP: 7 meq/L (ref 3–11)
AST: 43 U/L — ABNORMAL HIGH (ref 5–34)
Albumin: 3.5 g/dL (ref 3.5–5.0)
Alkaline Phosphatase: 88 U/L (ref 40–150)
BILIRUBIN TOTAL: 0.33 mg/dL (ref 0.20–1.20)
BUN: 15.3 mg/dL (ref 7.0–26.0)
CO2: 29 meq/L (ref 22–29)
CREATININE: 0.8 mg/dL (ref 0.6–1.1)
Calcium: 9.1 mg/dL (ref 8.4–10.4)
Chloride: 105 mEq/L (ref 98–109)
EGFR: >90 mL/min/{1.73_m2} (ref >90)
Glucose: 97 mg/dl (ref 70–140)
Potassium: 3.6 mEq/L (ref 3.5–5.1)
Sodium: 141 mEq/L (ref 136–145)
TOTAL PROTEIN: 6.6 g/dL (ref 6.4–8.3)

## 2016-07-10 LAB — CBC WITH DIFFERENTIAL/PLATELET
BASO%: 0.7 % (ref 0.0–2.0)
BASOS ABS: 0 10*3/uL (ref 0.0–0.1)
EOS ABS: 0 10*3/uL (ref 0.0–0.5)
EOS%: 1.4 % (ref 0.0–7.0)
HCT: 31.7 % — ABNORMAL LOW (ref 34.8–46.6)
HEMOGLOBIN: 10.9 g/dL — AB (ref 11.6–15.9)
LYMPH%: 9.9 % — ABNORMAL LOW (ref 14.0–49.7)
MCH: 30.3 pg (ref 25.1–34.0)
MCHC: 34.2 g/dL (ref 31.5–36.0)
MCV: 88.7 fL (ref 79.5–101.0)
MONO#: 0.2 10*3/uL (ref 0.1–0.9)
MONO%: 6.9 % (ref 0.0–14.0)
NEUT#: 2.8 10*3/uL (ref 1.5–6.5)
NEUT%: 81.1 % — AB (ref 38.4–76.8)
Platelets: 268 10*3/uL (ref 145–400)
RBC: 3.58 10*6/uL — AB (ref 3.70–5.45)
RDW: 26.3 % — ABNORMAL HIGH (ref 11.2–14.5)
WBC: 3.5 10*3/uL — ABNORMAL LOW (ref 3.9–10.3)
lymph#: 0.3 10*3/uL — ABNORMAL LOW (ref 0.9–3.3)

## 2016-07-10 LAB — TECHNOLOGIST REVIEW

## 2016-07-10 MED ORDER — SODIUM CHLORIDE 0.9% FLUSH
10.0000 mL | INTRAVENOUS | Status: DC | PRN
  Administered 2016-07-10: 10 mL via INTRAVENOUS
  Filled 2016-07-10: qty 10

## 2016-07-10 NOTE — Progress Notes (Signed)
Patient Care Team: [name], DO as PCP - General (Family Medicine) [name], MD as Consulting Physician (General Surgery) [name], MD as Consulting Physician (Hematology and Oncology) [name], MD as Consulting Physician (Radiation Oncology) [name], NP as Nurse Practitioner (Hematology and Oncology)  DIAGNOSIS:  Encounter Diagnosis  Name Primary?  . Malignant neoplasm of upper-outer quadrant of left breast in female, estrogen receptor negative ([place])     SUMMARY OF ONCOLOGIC HISTORY:   Malignant neoplasm of upper-outer quadrant of left breast in female, estrogen receptor negative ([place])   03/20/1995 Initial Biopsy    Head and Neck Cancer      02/09/2016 Initial Diagnosis    Left breast biopsy 2:00: IDC, left axillary lymph node biopsy negative, grade 3, ER 0%, PR 0%, Ki-67 40%, HER-2 negative ratio 1.31, screening detected left breast mass 1.8 cm, left axillary lymph node 5 mm benign, T1 CN 0 stage IA clinical stage      03/13/2016 Surgery    Left lumpectomy: IDC 2 cm, margins negative, 0/2 lymph nodes, grade 3, ER 0%, PR 0%, HER-2 negative ratio 1.31, Ki-67 40%, T1 CN 0 stage IA      04/10/2016 - 07/03/2016 Chemotherapy    Adjuvant chemotherapy with dose dense Adriamycin and Cytoxan followed by Taxol weekly 7, stopped early due to neuropathy       CHIEF COMPLIANT: Cycle 8 Taxol  INTERVAL HISTORY: Carla Pierce is a 46 year old with above-mentioned history left breast cancer treated with lumpectomy and is currently on adjuvant chemotherapy. Today is cycle 8 of Taxol. She has worsening chemotherapy-induced neuropathy with tingling and numbness of hands and feet. These symptoms have gotten much worse. She is not able to button her shirt and she is dropping objects.  REVIEW OF SYSTEMS:   Constitutional: Denies fevers, chills or abnormal weight loss Eyes: Denies blurriness of vision Ears, nose, mouth, throat, and face: Denies mucositis or  sore throat Respiratory: Denies cough, dyspnea or wheezes Cardiovascular: Denies palpitation, chest discomfort Gastrointestinal:  Denies nausea, heartburn or change in bowel habits Skin: Denies abnormal skin rashes Lymphatics: Denies new lymphadenopathy or easy bruising Neurological: Tingling and numbness of hands and feet, nail changes Behavioral/Psych: Mood is stable, no new changes  Extremities: No lower extremity edema All other systems were reviewed with the patient and are negative.  I have reviewed the past medical history, past surgical history, social history and family history with the patient and they are unchanged from previous note.  ALLERGIES:  has No Known Allergies.  MEDICATIONS:  Current Outpatient Prescriptions  Medication Sig Dispense Refill  . albuterol (PROVENTIL HFA;VENTOLIN HFA) 108 (90 Base) MCG/ACT inhaler Inhale 2 puffs into the lungs every 6 (six) hours as needed for wheezing or shortness of breath. 1 Inhaler 2  . AMLODIPINE BESYLATE PO Take 10 mg by mouth.    . benzonatate (TESSALON) 100 MG capsule Take 1 capsule (100 mg total) by mouth 2 (two) times daily as needed for cough. 20 capsule 0  . cholecalciferol (VITAMIN D) 1000 units tablet Take 1,000 Units by mouth daily.    . fluticasone (FLONASE) 50 MCG/ACT nasal spray Place into both nostrils daily.    Marland Kitchen levothyroxine (SYNTHROID, LEVOTHROID) 200 MCG tablet Take 200 mcg by mouth daily before breakfast.    . levothyroxine (SYNTHROID, LEVOTHROID) 50 MCG tablet Take 50 mcg by mouth daily before breakfast.    . loratadine (CLARITIN) 10 MG tablet Take 10 mg by mouth daily.    . Naproxen  Sodium (ALEVE) 220 MG CAPS Take 1 capsule by mouth. As needed for pain    . oxyCODONE (OXY IR/ROXICODONE) 5 MG immediate release tablet Take 1-2 tablets (5-10 mg total) by mouth every 6 (six) hours as needed for severe pain. 20 tablet 0  . potassium chloride (MICRO-K) 10 MEQ CR capsule Take 1 capsule (10 mEq total) by mouth 2 (two)  times daily. 30 capsule 1  . potassium chloride 20 MEQ/15ML (10%) SOLN Take 7.5 mLs (10 mEq total) by mouth 2 (two) times daily. 240 mL 1  . promethazine (PHENERGAN) 25 MG/ML injection Inject 0.25-0.5 mLs (6.25-12.5 mg total) into the vein every 15 (fifteen) minutes as needed for nausea. 1 mL 0  . valsartan-hydrochlorothiazide (DIOVAN-HCT) 320-25 MG tablet Take 1 tablet by mouth daily.     No current facility-administered medications for this visit.    Facility-Administered Medications Ordered in Other Visits  Medication Dose Route Frequency Provider Last Rate Last Dose  . sodium chloride flush (NS) 0.9 % injection 10 mL  10 mL Intravenous PRN [name], MD   10 mL at 04/12/16 1512    PHYSICAL EXAMINATION: ECOG PERFORMANCE STATUS: 1 - Symptomatic but completely ambulatory  Vitals:   07/10/16 0845  BP: (!) 146/83  Pulse: 87  Resp: 18  Temp: 98.2 F (36.8 C)   Filed Weights   07/10/16 0845  Weight: (!) 305 lb 6.4 oz (138.5 kg)    GENERAL:alert, no distress and comfortable SKIN: skin color, texture, turgor are normal, no rashes or significant lesions EYES: normal, Conjunctiva are pink and non-injected, sclera clear OROPHARYNX:no exudate, no erythema and lips, buccal mucosa, and tongue normal  NECK: supple, thyroid normal size, non-tender, without nodularity LYMPH:  no palpable lymphadenopathy in the cervical, axillary or inguinal LUNGS: clear to auscultation and percussion with normal breathing effort HEART: regular rate & rhythm and no murmurs and no lower extremity edema ABDOMEN:abdomen soft, non-tender and normal bowel sounds MUSCULOSKELETAL:no cyanosis of digits and no clubbing  NEURO: Neuropathy grade 2 and worsening EXTREMITIES: No lower extremity edema   LABORATORY DATA:  I have reviewed the data as listed   Chemistry      Component Value Date/Time   NA 144 07/03/2016 0809   K 3.6 07/03/2016 0809   CO2 29 07/03/2016 0809   BUN 14.7 07/03/2016 0809    CREATININE 0.9 07/03/2016 0809      Component Value Date/Time   CALCIUM 9.3 07/03/2016 0809   ALKPHOS 76 07/03/2016 0809   AST 40 (H) 07/03/2016 0809   ALT 32 07/03/2016 0809   BILITOT 0.28 07/03/2016 0809       Lab Results  Component Value Date   WBC 3.5 (L) 07/10/2016   HGB 10.9 (L) 07/10/2016   HCT 31.7 (L) 07/10/2016   MCV 88.7 07/10/2016   PLT 268 07/10/2016   NEUTROABS 2.8 07/10/2016    ASSESSMENT & PLAN:  Malignant neoplasm of upper-outer quadrant of left breast in female, estrogen receptor negative ([facility]) Left lumpectomy 03/08/2016: IDC 2 cm, margins negative, 0/2 lymph nodes, grade 3, ER 0%, PR 0%, HER-2 negative ratio 1.31, Ki-67 40%, T1 CN 0 stage IA  Treatment plan: 1. Adjuvant chemotherapy with dose dense Adriamycin and Cytoxan 4 followed by weekly Taxol 12 started 04/17/2016 2. Followed by radiation therapy ----------------------------------------------------------------------------------------------------------------------------------------- Current treatment: Cycle 8Taxol being discontinued for neuropathy Closely monitoring for chemotherapy toxicities.  Chemotherapy toxicities: 1. Nausea grade 1 2. fatigue grade 1 3. Chemotherapy-induced anemia grade 2: Monitoring closely 4. Bilateral muscle pain  in the thighs: Encouraged her to drink more water 5. 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 Because of this we decided to discontinue further chemotherapy. Patient's blood work was reviewed.  She will undergo radiation in [place]. I would like to see her back in 6 months for follow-up. Patient plans to get back to her feet by exercising and staying active.  I spent 25 minutes talking to the patient of which more than half was spent in counseling and coordination of care.  No orders of the defined types were placed in this encounter.  The  patient has a good understanding of the overall plan. she agrees with it. she will call with any problems that may develop before the next visit here.   Rulon Eisenmenger, MD 07/10/16

## 2016-07-10 NOTE — Assessment & Plan Note (Signed)
Left lumpectomy 03/08/2016: IDC 2 cm, margins negative, 0/2 lymph nodes, grade 3, ER 0%, PR 0%, HER-2 negative ratio 1.31, Ki-67 40%, T1 CN 0 stage IA  Treatment plan: 1. Adjuvant chemotherapy with dose dense Adriamycin and Cytoxan 4 followed by weekly Taxol 12 started 04/17/2016 2. Followed by radiation therapy ----------------------------------------------------------------------------------------------------------------------------------------- Current treatment: Cycle 8Taxol Closely monitoring for chemotherapy toxicities.  Chemotherapy toxicities: 1. Nausea grade 1 2. fatigue grade 1 3. Chemotherapy-induced anemia grade 2: Monitoring closely 4. Bilateral muscle pain in the thighs: Encouraged her to drink more water 5. 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 Symptoms are stable Patient's blood work was reviewed.  Return to clinic in 2weekfor cycle 10of Taxol.

## 2016-07-10 NOTE — Patient Instructions (Signed)

## 2016-07-17 ENCOUNTER — Other Ambulatory Visit: Payer: BLUE CROSS/BLUE SHIELD

## 2016-07-17 ENCOUNTER — Ambulatory Visit: Payer: BLUE CROSS/BLUE SHIELD

## 2016-07-18 ENCOUNTER — Telehealth: Payer: Self-pay | Admitting: Hematology and Oncology

## 2016-07-18 NOTE — Telephone Encounter (Signed)
Pt appt with [facility] is 08/07/16@8 :00 Pt is aware Cd will be fedex'ed

## 2016-07-24 ENCOUNTER — Ambulatory Visit: Payer: BLUE CROSS/BLUE SHIELD | Admitting: Hematology and Oncology

## 2016-07-24 ENCOUNTER — Other Ambulatory Visit: Payer: BLUE CROSS/BLUE SHIELD

## 2016-07-24 ENCOUNTER — Ambulatory Visit: Payer: BLUE CROSS/BLUE SHIELD

## 2016-08-31 ENCOUNTER — Telehealth: Payer: Self-pay | Admitting: Hematology and Oncology

## 2016-08-31 NOTE — Telephone Encounter (Signed)
Spoke with patient to schedule a Gen counsel appt per 8/30 los. Transferred her to the nurse because she wanted to see if she needed a flush.

## 2016-09-20 ENCOUNTER — Ambulatory Visit (HOSPITAL_BASED_OUTPATIENT_CLINIC_OR_DEPARTMENT_OTHER): Payer: BLUE CROSS/BLUE SHIELD | Admitting: Genetics

## 2016-09-20 ENCOUNTER — Other Ambulatory Visit: Payer: BLUE CROSS/BLUE SHIELD

## 2016-09-20 ENCOUNTER — Encounter: Payer: Self-pay | Admitting: Genetics

## 2016-09-20 DIAGNOSIS — Z807 Family history of other malignant neoplasms of lymphoid, hematopoietic and related tissues: Secondary | ICD-10-CM

## 2016-09-20 DIAGNOSIS — C50412 Malignant neoplasm of upper-outer quadrant of left female breast: Secondary | ICD-10-CM

## 2016-09-20 DIAGNOSIS — Z171 Estrogen receptor negative status [ER-]: Secondary | ICD-10-CM | POA: Diagnosis not present

## 2016-09-20 DIAGNOSIS — Z8042 Family history of malignant neoplasm of prostate: Secondary | ICD-10-CM

## 2016-09-20 DIAGNOSIS — Z803 Family history of malignant neoplasm of breast: Secondary | ICD-10-CM | POA: Diagnosis not present

## 2016-09-20 DIAGNOSIS — Z8052 Family history of malignant neoplasm of bladder: Secondary | ICD-10-CM | POA: Diagnosis not present

## 2016-09-20 NOTE — Progress Notes (Signed)
REFERRING PROVIDER: Hayden Pedro, PA-C [place], [place] [address]  PRIMARY PROVIDER:  [name], DO  PRIMARY REASON FOR VISIT:  1. Malignant neoplasm of upper-outer quadrant of left breast in female, estrogen receptor negative ([place])   2. Family history of breast cancer   3. Family history of Hodgkin's lymphoma   4. Family history of prostate cancer   5. Family history of bladder cancer     HISTORY OF PRESENT ILLNESS:   Carla Pierce, a 46 y.o. female, was seen for a [facility] cancer genetics consultation at the request of Dr. Dara Lords due to a personal and family history of cancer.  Carla Pierce presents to clinic today to discuss the possibility of a hereditary predisposition to cancer, genetic testing, and to further clarify her future cancer risks, as well as potential cancer risks for family members.   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 This was treated with partial mastectomy, adjuvant chemotherapy,and radiation.  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     CANCER HISTORY:    Malignant neoplasm of upper-outer quadrant of left breast in female, estrogen receptor negative ([place])   03/20/1995 Initial Biopsy    Head and Neck Cancer      02/09/2016 Initial Diagnosis    Left breast biopsy 2:00: IDC, left axillary lymph node biopsy negative, grade 3, ER 0%, PR 0%, Ki-67 40%, HER-2 negative ratio 1.31, screening detected left breast mass 1.8 cm, left axillary lymph node 5 mm benign, T1 CN 0 stage IA clinical stage      03/13/2016 Surgery    Left lumpectomy: IDC 2 cm, margins negative, 0/2 lymph nodes, grade 3, ER 0%, PR 0%, HER-2 negative ratio 1.31, Ki-67 40%, T1 CN 0 stage IA      04/10/2016 - 07/03/2016 Chemotherapy    Adjuvant chemotherapy with dose dense Adriamycin and Cytoxan followed by Taxol weekly 7, 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  Ovaries intact: yes.  Hysterectomy: no.  Colonoscopy: no; not examined. Mammogram within the last year: yes.  Past Medical History:  Diagnosis Date  . Anemia   . Breast cancer ([place])   . Cancer ([place]) 1997   squamous cell cancer of neck-had surgery, chemo and radiation  . Complication of anesthesia    trouble waking up with neck biopsy in 1997-was in ICU  . Family history of bladder cancer   . Family history of breast cancer   . Family history of Hodgkin's lymphoma   . Family history of lung cancer   . Family history of prostate cancer   . GERD (gastroesophageal reflux disease)    uses OTC as needed  . History of head and neck cancer   . Hypertension   . Hypothyroidism   . Seasonal allergies     Past Surgical History:  Procedure Laterality Date  . CHOLECYSTECTOMY    . NECK LESION BIOPSY    . NECK SURGERY Left 1997   for SCC  . PORTACATH PLACEMENT Right 03/13/2016   Procedure: INSERTION PORT-A-CATH;  Surgeon: [name], MD;  Location: [facility];  Service: General;  Laterality: Right;  . RADIOACTIVE SEED GUIDED MASTECTOMY WITH AXILLARY SENTINEL LYMPH NODE BIOPSY Left 03/13/2016   Procedure: LEFT BREAST RADIOACTIVE SEED GUIDED PARTIAL MASTECTOMY WITH AXILLARY  SENTINEL LYMPH NODE BIOPSY;  Surgeon: [name], MD;  Location: [facility];  Service: General;  Laterality: Left;  . TUBAL LIGATION      Social History   Social History  . Marital status: Single    Spouse name: N/A  . Number of children: N/A  . Years of education: N/A   Social History Main Topics  . Smoking status: Never Smoker  . Smokeless tobacco: Never Used  . Alcohol use Yes     Comment: once a month  . Drug use: No  . Sexual activity: Yes    Birth control/ protection: Surgical   Other Topics Concern  . None   Social History Narrative  . None     FAMILY HISTORY:  We obtained a detailed, 4-generation  family history.  Significant diagnoses are listed below: Family History  Problem Relation Age of Onset  . Breast cancer Mother 12  . Bladder Cancer Father 79  . Breast cancer Maternal Grandmother 74  . Lung cancer Maternal Grandfather 65       hx smoking  . Hodgkin's lymphoma Maternal Aunt 78   Carla Pierce has a 63 year-old son with no history of cancer.  Carla Pierce has 5 paternal half brothers and 4 paternal half-sisters between the ages of 35-55 years with no history of cancer.  These half siblings have had several children, none with any history of cancer.    Carla Pierce' father died at 17 due to bladder cancer.  He had a large amount of siblings, none with any known history of cancer.  All of these aunts and uncles either died >50 years or are still living at ages >74.  No information is known about Carla Pierce' paternal grandparents.    Carla Pierce mother died at 41 due to breast cancer.  Carla Pierce has 3 maternal uncles and 6 maternal aunts.  1 maternal aunt was diagnosed with Hodgkins Lymphoma at 31 and is now 62.  She has 2 sons (patient's cousins) without any history of cancer.  Carla Pierce 3 uncles are ages 17-71 with no history of cancer.  Carla Pierce' maternal aunts are ages 29+ without any history of cancer.  Carla Pierce has several maternal cousins, none with any history of cancer.  Carla Pierce' maternal grandfather died in hist 60's due to lung caner-he had a hx of smoking. Carla Pierce' maternal grandmother was diagnosed with breast cancer in her 72's and died at 14.  This grandmother had a brother (patient's great uncle) and father (patient's great grandfather) with prostate cancer diagnosed >50.    Carla Pierce is unaware of previous family history of genetic testing for hereditary cancer risks. She has had previous genetic testing performed in reported September 03, 2013 with analysis the BRCA1 and BRCA2 genes only (no panel).  There is written record of these results in her chart  and the patient had picture of her original results on her phone that she will send.  Patient's maternal ancestors are of African American descent, and paternal ancestors are of African American/Native American descent. There is no reported Ashkenazi Jewish ancestry. There is no known consanguinity.  GENETIC COUNSELING ASSESSMENT: Carla Pierce is a 46 y.o. female with a personal and family history which is somewhat suggestive of a Hereditary Cancer Predisposition Syndrome. We, therefore, discussed and recommended the following at today's visit.   DISCUSSION: We reviewed the characteristics, features and inheritance patterns of hereditary cancer syndromes. We also discussed genetic testing, including  the appropriate family members to test, the process of testing, insurance coverage and turn-around-time for results. We discussed the implications of a negative, positive and/or variant of uncertain significant result. We recommended Carla Pierce pursue genetic testing for the Common Hereditary Cancer gene panel. The Hereditary Gene Panel offered by [facility] includes sequencing and/or deletion duplication testing of the following 46 genes: APC, ATM, AXIN2, BARD1, BMPR1A, BRCA1, BRCA2, BRIP1, CDH1, CDKN2A (p14ARF), CDKN2A (p16INK4a), CHEK2, CTNNA1, DICER1, EPCAM (Deletion/duplication testing only), GREM1 (promoter region deletion/duplication testing only), KIT, MEN1, MLH1, MSH2, MSH3, MSH6, MUTYH, NBN, NF1, NHTL1, PALB2, PDGFRA, PMS2, POLD1, POLE, PTEN, RAD50, RAD51C, RAD51D, SDHB, SDHC, SDHD, SMAD4, SMARCA4. STK11, TP53, TSC1, TSC2, and VHL.  The following genes were evaluated for sequence changes only: SDHA and HOXB13 c.251G>A variant only.  We discussed that only 5-10% of cancers are associated with a Hereditary cancer predisposition syndrome.  One of the most common hereditary cancer syndromes that increases breast cancer risk is called Hereditary Breast and Ovarian Cancer (HBOC) syndrome.  This syndrome is  caused by mutations in the BRCA1 and BRCA2 genes.  This syndrome increases an individual's lifetime risk to develop breast, ovarian, pancreatic, and other types of cancer.  There are also many other cancer predisposition syndromes caused by mutations in several other genes.  We discussed that if she is found to have a mutation in one of these genes, it may impact future medical management recommendations such as increased cancer screenings and consideration of risk reducing surgeries.  A positive result could also have implications for the patient's family members.  A Negative result would mean we were unable to identify a hereditary component to her cancer, but does not rule out the possibility of a hereditary basis for her cancer.  There could be mutations that are undetectable by current technology, or in genes not yet tested or identified to increase cancer risk.    We discussed the potential to find a Variant of Uncertain Significance or VUS.  These are variants that have not yet been identified as pathogenic or benign, and it is unknown if this variant is associated with increased cancer risk or if this is a normal finding.  Most VUS's are reclassified to benign or likely benign.   It should not be used to make medical management decisions. With time, we suspect the lab will determine the significance of any VUS's identified if any.   Based on Carla Pierce. Mantey's personal and family history of cancer, she meets medical criteria for genetic testing. Despite that she meets criteria, she may still have an out of pocket cost. We discussed that if her out of pocket cost for testing is over $100, the laboratory will call and confirm whether she wants to proceed with testing.  If the out of pocket cost of testing is less than $100 she will be billed by the genetic testing laboratory.    PLAN: After considering the risks, benefits, and limitations, Carla Pierce. Wheeler  provided informed consent to pursue genetic testing  and the blood sample was sent to [facility] for analysis of the Common Hereditary Cancer Panel. Results should be available within approximately 2-3 weeks' time, at which point they will be disclosed by telephone to Carla Pierce. Dingledine, as will any additional recommendations warranted by these results. Carla Pierce. Hoel will receive a summary of her genetic counseling visit and a copy of her results once available. This information will also be available in Epic. We encouraged Carla Pierce. Tipler to remain in contact with cancer genetics  annually so that we can continuously update the family history and inform her of any changes in cancer genetics and testing that may be of benefit for her family. Carla Pierce. Garland questions were answered to her satisfaction today. Our contact information was provided should additional questions or concerns arise.  Based on Carla Pierce. Littler's family history, we recommended her maternal relatives (aunts/uncles/cousins, etc), have genetic counseling and testing. Carla Pierce. Klich will let us know if we can be of any assistance in coordinating genetic counseling and/or testing for this family member.   Lastly, we encouraged Carla Pierce. Hanlon to remain in contact with cancer genetics annually so that we can continuously update the family history and inform her of any changes in cancer genetics and testing that may be of benefit for this family.   Carla Pierce.  Giles questions were answered to her satisfaction today. Our contact information was provided should additional questions or concerns arise. Thank you for the referral and allowing [place] to share in the care of your patient.   Tana Felts, Carla Pierce Genetic Counselor [email or web address] phone: [phone]  The patient was seen for a total of 35 minutes in face-to-face genetic counseling.  The patient was accompanied today by her cousin. This patient was discussed with Drs. Magrinat, Lindi Adie and/or Burr Medico who agrees with the above.

## 2016-10-11 ENCOUNTER — Encounter: Payer: Self-pay | Admitting: Genetics

## 2016-10-11 ENCOUNTER — Telehealth: Payer: Self-pay | Admitting: Genetics

## 2016-10-11 ENCOUNTER — Ambulatory Visit: Payer: Self-pay | Admitting: Genetics

## 2016-10-11 DIAGNOSIS — Z807 Family history of other malignant neoplasms of lymphoid, hematopoietic and related tissues: Secondary | ICD-10-CM

## 2016-10-11 DIAGNOSIS — C50412 Malignant neoplasm of upper-outer quadrant of left female breast: Secondary | ICD-10-CM

## 2016-10-11 DIAGNOSIS — Z803 Family history of malignant neoplasm of breast: Secondary | ICD-10-CM

## 2016-10-11 DIAGNOSIS — Z8042 Family history of malignant neoplasm of prostate: Secondary | ICD-10-CM

## 2016-10-11 DIAGNOSIS — Z171 Estrogen receptor negative status [ER-]: Secondary | ICD-10-CM

## 2016-10-11 DIAGNOSIS — Z8052 Family history of malignant neoplasm of bladder: Secondary | ICD-10-CM

## 2016-10-11 DIAGNOSIS — Z1379 Encounter for other screening for genetic and chromosomal anomalies: Secondary | ICD-10-CM | POA: Insufficient documentation

## 2016-10-11 NOTE — Telephone Encounter (Signed)
Revealed negative genetic testing.  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  While reassuring, we do not know why she has breast cancer or why there is cancer in the family. It could be due to a different gene that we are not testing, or maybe our current technology may not be able to pick something up.  It will be important for her to keep in contact with genetics to learn if additional testing may be needed in the future.  Informed her that other maternal relatives (aunts uncles, etc) appear to meet criteria for genetic testing as well.

## 2016-10-11 NOTE — Progress Notes (Signed)
HPI: Ms. Hartzog was previously seen in the [facility] clinic on 09/20/2016 due to a personal history of breast cancer, family history of breast and prostate cancer, and concerns regarding a hereditary predisposition to cancer. Please refer to our prior cancer genetics clinic note for more information regarding Ms. Diluzio's medical, social and family histories, and our assessment and recommendations, at the time. Ms. Rapaport recent genetic test results were disclosed to her, as well as recommendations warranted by these results. These results and recommendations are discussed in more detail below.  CANCER HISTORY:    Malignant neoplasm of upper-outer quadrant of left breast in female, estrogen receptor negative ([place])   03/20/1995 Initial Biopsy    Head and Neck Cancer      02/09/2016 Initial Diagnosis    Left breast biopsy 2:00: IDC, left axillary lymph node biopsy negative, grade 3, ER 0%, PR 0%, Ki-67 40%, HER-2 negative ratio 1.31, screening detected left breast mass 1.8 cm, left axillary lymph node 5 mm benign, T1 CN 0 stage IA clinical stage      03/13/2016 Surgery    Left lumpectomy: IDC 2 cm, margins negative, 0/2 lymph nodes, grade 3, ER 0%, PR 0%, HER-2 negative ratio 1.31, Ki-67 40%, T1 CN 0 stage IA      04/10/2016 - 07/03/2016 Chemotherapy    Adjuvant chemotherapy with dose dense Adriamycin and Cytoxan followed by Taxol weekly 7, 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  The Common Hereditary Cancer Panel was ordered. The Hereditary Gene Panel offered by [facility] includes sequencing and/or deletion duplication testing of the following 46 genes: APC, ATM, AXIN2, BARD1, BMPR1A, BRCA1, BRCA2, BRIP1, CDH1, CDKN2A (p14ARF), CDKN2A (p16INK4a), CHEK2, CTNNA1, DICER1, EPCAM (Deletion/duplication testing only), GREM1 (promoter  region deletion/duplication testing only), KIT, MEN1, MLH1, MSH2, MSH3, MSH6, MUTYH, NBN, NF1, NHTL1, PALB2, PDGFRA, PMS2, POLD1, POLE, PTEN, RAD50, RAD51C, RAD51D, SDHB, SDHC, SDHD, SMAD4, SMARCA4. STK11, TP53, TSC1, TSC2, and VHL.  The following genes were evaluated for sequence changes only: SDHA and HOXB13 c.251G>A variant only.     Results: No pathogenic mutations identified.  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  c.2995A>T (p.Ser999Cys)  The date of this test report is 10/11/2106.         FAMILY HISTORY:  We obtained a detailed, 4-generation family history.  Significant diagnoses are listed below: Family History  Problem Relation Age of Onset  . Breast cancer Mother 6  . Bladder Cancer Father 32  . Breast cancer Maternal Grandmother 61  . Lung cancer Maternal Grandfather 65       hx smoking  . Hodgkin's lymphoma Maternal Aunt 23   Ms. Rosario has a 46 year-old son with no history of cancer.  Ms. Sundeen has 5 paternal half brothers and 4 paternal half-sisters between the ages of 36-55 years with no history of cancer.  These half siblings have had several children, none with any history of cancer.    Ms. Ellwood' father died at 17 due to bladder cancer.  He had a large amount of siblings, none with any known history of cancer.  All of these aunts and uncles either died >50 years or are still living at ages >20.  No information is known about Ms. Levandowski' paternal grandparents.    Ms. Alvia Grove mother died at 41 due to  breast cancer.  Ms. Salem has 3 maternal uncles and 6 maternal aunts.  1 maternal aunt was diagnosed with Hodgkins Lymphoma at 65 and is now 73.  She has 2 sons (patient's cousins) without any history of cancer.  Ms. Alvia Grove 3 uncles are ages 33-71 with no history of cancer.  Ms. Delpriore' maternal aunts are ages 77+ without any history of cancer.  Ms. Comley has several maternal cousins, none with any history of cancer.  Ms. Karim' maternal grandfather died in hist  60's due to lung caner-he had a hx of smoking. Ms. Blatter' maternal grandmother was diagnosed with breast cancer in her 30's and died at 38.  This grandmother had a brother (patient's great uncle) and father (patient's great grandfather) with prostate cancer diagnosed >50.    Ms. Imel is unaware of previous family history of genetic testing for hereditary cancer risks. She has had previous genetic testing performed in reported September 03, 2013 with analysis the BRCA1 and BRCA2 genes only (no panel).  There is written record of these results in her chart and the patient had picture of her original results on her phone that she will send.  Patient's maternal ancestors are of African American descent, and paternal ancestors are of African American/Native American descent. There is no reported Ashkenazi Jewish ancestry. There is no known consanguinity.  GENETIC TEST RESULTS: Genetic testing performed through [facility]'s Common Hereditary Cancer Panel reported out on 10/10/2016 showed no pathogenic mutations. The Hereditary Gene Panel offered by [facility] includes sequencing and/or deletion duplication testing of the following 46 genes: APC, ATM, AXIN2, BARD1, BMPR1A, BRCA1, BRCA2, BRIP1, CDH1, CDKN2A (p14ARF), CDKN2A (p16INK4a), CHEK2, CTNNA1, DICER1, EPCAM (Deletion/duplication testing only), GREM1 (promoter region deletion/duplication testing only), KIT, MEN1, MLH1, MSH2, MSH3, MSH6, MUTYH, NBN, NF1, NHTL1, PALB2, PDGFRA, PMS2, POLD1, POLE, PTEN, RAD50, RAD51C, RAD51D, SDHB, SDHC, SDHD, SMAD4, SMARCA4. STK11, TP53, TSC1, TSC2, and VHL.  The following genes were evaluated for sequence changes only: SDHA and HOXB13 c.251G>A variant only..  A variant of uncertain significance (VUS) in a gene called TSC2 was also noted. c.2995A>T (p.Ser999Cys)  The test report will be scanned into EPIC and will be located under the Molecular Pathology section of the Results Review tab.A portion of the result report is included  below for reference.     We discussed with Ms. Michie that because current genetic testing is not perfect, it is possible there may be a gene mutation in one of these genes that current testing cannot detect, but that chance is small. We also discussed, that there could be another gene that has not yet been discovered, or that we have not yet tested, that is responsible for the cancer diagnoses in the family. It is also possible that there is a hereditary cause for the cancer in the family that Ms. Millican did not inherit and therefore was not identified on her genetic testing.  Therefore, it is important to remain in touch with cancer genetics in the future so that we can continue to offer Ms. Lukasiewicz the most up to date genetic testing.     Regarding the VUS in TSC2: At this time, it is unknown if this variant is associated with increased cancer risk or if this is a normal finding, but most variants such as this get reclassified to being inconsequential. It should not be used to make medical management decisions. With time, we suspect the lab will determine the significance of this variant, if any. If we do learn more about  it, we will try to contact Ms. Franko to discuss it further. However, it is important to stay in touch with [place] periodically and keep the address and phone number up to date.  ADDITIONAL GENETIC TESTING: We discussed with Ms. Ciesla that there are other genes that are associated with increased cancer risk that can be analyzed. The laboratories that offer this testing look at these additional genes via a hereditary cancer gene panel. Should Ms. Noorani wish to pursue additional genetic testing, we are happy to discuss and coordinate this testing, at any time.    CANCER SCREENING RECOMMENDATIONS: This negative result means that we were unable to identify a hereditary cause for her personal and family history of cancer at this time.  This result does not mean there is not a  hereditary basis for her cancer.  It is still possible that there could be genetic mutations that are undetectable by current technology, or genetic mutations in genes that have not been tested or identified to increase cancer risk. We recommend she continue to follow the cancer screening recommendations provided to her by her oncology and primary healthcare providers.  Her personal and family history still affect her cancer risk despite negative genetic testing.    RECOMMENDATIONS FOR FAMILY MEMBERS: Women in this family are at some increased risk of developing cancer, over the general population risk, simply due to the family history of cancer. We recommended women in this family have a yearly mammogram beginning at age 68, or 10 years younger than the earliest onset of cancer, an annual clinical breast exam, and perform monthly breast self-exams. Women in this family should also have a gynecological exam as recommended by their primary provider. All family members should have a colonoscopy by age 28.  Based on Ms. Minish's family history, we recommended her maternal relatives (aunts/uncles/cousins) consider having genetic counseling and testing. Ms. Herd will let us know if we can be of any assistance in coordinating genetic counseling and/or testing for these family members.   FOLLOW-UP: Lastly, we discussed with Ms. Morris that cancer genetics is a rapidly advancing field and it is possible that new genetic tests will be appropriate for her and/or her family members in the future. We encouraged her to remain in contact with cancer genetics on an annual basis so we can update her personal and family histories and let her know of advances in cancer genetics that may benefit this family.   Our contact number was provided. Ms. Blas questions were answered to her satisfaction, and she knows she is welcome to call us at anytime with additional questions or concerns.   Ferol Luz, MS Genetic  Counselor [email or web address]

## 2017-01-02 ENCOUNTER — Telehealth: Payer: Self-pay

## 2017-01-02 NOTE — Telephone Encounter (Signed)
Spoke with patient to remind her of [facility] visit on 01/10/17 at 11 am.  Patient said she will come to appt.

## 2017-01-10 ENCOUNTER — Inpatient Hospital Stay: Payer: BLUE CROSS/BLUE SHIELD

## 2017-01-10 ENCOUNTER — Inpatient Hospital Stay: Payer: BLUE CROSS/BLUE SHIELD | Attending: Hematology and Oncology | Admitting: Adult Health

## 2017-01-10 ENCOUNTER — Telehealth: Payer: Self-pay | Admitting: Adult Health

## 2017-01-10 ENCOUNTER — Encounter: Payer: Self-pay | Admitting: Adult Health

## 2017-01-10 VITALS — BP 138/82 | HR 86 | Temp 98.1°F | Resp 17 | Wt 284.6 lb

## 2017-01-10 DIAGNOSIS — Z79899 Other long term (current) drug therapy: Secondary | ICD-10-CM | POA: Insufficient documentation

## 2017-01-10 DIAGNOSIS — Z171 Estrogen receptor negative status [ER-]: Principal | ICD-10-CM

## 2017-01-10 DIAGNOSIS — C50412 Malignant neoplasm of upper-outer quadrant of left female breast: Secondary | ICD-10-CM | POA: Diagnosis not present

## 2017-01-10 DIAGNOSIS — Z8589 Personal history of malignant neoplasm of other organs and systems: Secondary | ICD-10-CM | POA: Insufficient documentation

## 2017-01-10 DIAGNOSIS — N63 Unspecified lump in unspecified breast: Secondary | ICD-10-CM

## 2017-01-10 DIAGNOSIS — Z923 Personal history of irradiation: Secondary | ICD-10-CM | POA: Diagnosis not present

## 2017-01-10 DIAGNOSIS — Z9012 Acquired absence of left breast and nipple: Secondary | ICD-10-CM | POA: Diagnosis not present

## 2017-01-10 DIAGNOSIS — Z9221 Personal history of antineoplastic chemotherapy: Secondary | ICD-10-CM | POA: Insufficient documentation

## 2017-01-10 LAB — CBC WITH DIFFERENTIAL (CANCER CENTER ONLY)
BASOS ABS: 0 10*3/uL (ref 0.0–0.1)
Basophils Relative: 1 %
Eosinophils Absolute: 0.2 10*3/uL (ref 0.0–0.5)
Eosinophils Relative: 4 %
HEMATOCRIT: 37.4 % (ref 34.8–46.6)
Hemoglobin: 12.1 g/dL (ref 11.6–15.9)
LYMPHS PCT: 16 %
Lymphs Abs: 0.9 10*3/uL (ref 0.9–3.3)
MCH: 26.1 pg (ref 25.1–34.0)
MCHC: 32.3 g/dL (ref 31.5–36.0)
MCV: 80.7 fL (ref 79.5–101.0)
MONO ABS: 0.4 10*3/uL (ref 0.1–0.9)
Monocytes Relative: 7 %
NEUTROS ABS: 4 10*3/uL (ref 1.5–6.5)
Neutrophils Relative %: 72 %
Platelet Count: 281 10*3/uL (ref 145–400)
RBC: 4.63 MIL/uL (ref 3.70–5.45)
RDW: 19.2 % — ABNORMAL HIGH (ref 11.2–16.1)
WBC: 5.5 10*3/uL (ref 3.9–10.3)

## 2017-01-10 LAB — CMP (CANCER CENTER ONLY)
ALT: 19 U/L (ref 0–55)
AST: 38 U/L — AB (ref 5–34)
Albumin: 3.6 g/dL (ref 3.5–5.0)
Alkaline Phosphatase: 102 U/L (ref 40–150)
Anion gap: 8 (ref 3–11)
BILIRUBIN TOTAL: 0.4 mg/dL (ref 0.2–1.2)
BUN: 13 mg/dL (ref 7–26)
CO2: 33 mmol/L — ABNORMAL HIGH (ref 22–29)
CREATININE: 0.87 mg/dL (ref 0.60–1.10)
Calcium: 9.5 mg/dL (ref 8.4–10.4)
Chloride: 101 mmol/L (ref 98–109)
GFR, Est AFR Am: >60 mL/min (ref >60)
Glucose, Bld: 87 mg/dL (ref 70–140)
Potassium: 3.1 mmol/L — ABNORMAL LOW (ref 3.3–4.7)
Sodium: 142 mmol/L (ref 136–145)
TOTAL PROTEIN: 7.1 g/dL (ref 6.4–8.3)

## 2017-01-10 NOTE — Telephone Encounter (Signed)
Gave patient avs and calendar with appts per 1/10 los °

## 2017-01-10 NOTE — Progress Notes (Signed)
CLINIC:  Survivorship   REASON FOR VISIT:  Routine follow-up post-treatment for a recent history of breast cancer.  BRIEF ONCOLOGIC HISTORY:    Malignant neoplasm of upper-outer quadrant of left breast in female, estrogen receptor negative ([place])   03/20/1995 Initial Biopsy    Head and Neck Cancer      02/09/2016 Initial Diagnosis    Left breast biopsy 2:00: IDC, left axillary lymph node biopsy negative, grade 3, ER 0%, PR 0%, Ki-67 40%, HER-2 negative ratio 1.31, screening detected left breast mass 1.8 cm, left axillary lymph node 5 mm benign, T1 CN 0 stage IA clinical stage      03/13/2016 Surgery    Left lumpectomy: IDC 2 cm, margins negative, 0/2 lymph nodes, grade 3, ER 0%, PR 0%, HER-2 negative ratio 1.31, Ki-67 40%, T1 CN 0 stage IA      04/10/2016 - 07/03/2016 Chemotherapy    Adjuvant chemotherapy with dose dense Adriamycin and Cytoxan followed by Taxol weekly 7, 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 (received treatment in [place]).      10/10/2016 Genetic Testing    Genetic testing did not reveal a pathogenic mutation.  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 STK11, TP53, TSC1, TSC2, and VHL.  The following genes were evaluated for sequence changes only: SDHA and HOXB13 c.251G>A variant only.            INTERVAL HISTORY:  Ms. Carla presents to the [facility] today for our  initial meeting to review her survivorship care plan detailing her treatment course for breast cancer, as well as monitoring long-term side effects of that treatment, education regarding health maintenance, screening, and overall wellness and health promotion.     Overall, Ms. Pierce reports feeling quite well.  She has some mild pain at her surgical site near her radiation, otherwise, she is doing well.  She has intentionally lost about 20 pounds since last July.  Not long after her radiation, she had a "boil" on her breast.  She saw her OB/gyn and then she received a prescription for antibiotics.  Then two more popped up under her arm, and she took that a little bit longer.  Since then, she hasn't had any issues.     REVIEW OF SYSTEMS:  Review of Systems  Constitutional: Negative for appetite change, chills, fatigue, fever and unexpected weight change.  HENT:   Negative for hearing loss, lump/mass, sore throat and trouble swallowing.   Eyes: Negative for eye problems and icterus.  Respiratory: Negative for chest tightness, cough and shortness of breath.   Cardiovascular: Negative for chest pain, leg swelling and palpitations.  Gastrointestinal: Negative for abdominal distention, abdominal pain, constipation, diarrhea, nausea and vomiting.  Endocrine: Negative for hot flashes.  Genitourinary: Negative for difficulty urinating.   Musculoskeletal: Negative for arthralgias.  Skin: Negative for itching and rash.  Neurological: Negative for dizziness.  Hematological: Negative for adenopathy. Does not bruise/bleed easily.  Psychiatric/Behavioral: Negative for depression.  The patient is not nervous/anxious.   Breast: Denies any new nodularity, masses, tenderness, nipple changes, or nipple discharge.      ONCOLOGY TREATMENT TEAM:  1. Surgeon:  Dr. Brantley Stage at [facility] 2. Medical Oncologist: Dr. Lindi Adie  3. Radiation Oncologist: Dr. [name]    PAST MEDICAL/SURGICAL HISTORY:  Past  Medical History:  Diagnosis Date  . Anemia   . Breast cancer ([place])   . 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 Complication of anesthesia    trouble waking up with neck biopsy in 1997-was in ICU  . Family history of bladder cancer   . Family history of breast cancer   . Family history of Hodgkin's lymphoma   . Family history of lung cancer   . Family history of prostate cancer   . GERD (gastroesophageal reflux disease)    uses OTC as needed  . History of head and neck cancer   . Hypertension   . Hypothyroidism   . Seasonal allergies    Past Surgical History:  Procedure Laterality Date  . CHOLECYSTECTOMY    . NECK LESION BIOPSY    . NECK SURGERY Left 1997   for SCC  . PORTACATH PLACEMENT Right 03/13/2016   Procedure: INSERTION PORT-A-CATH;  Surgeon: [name], MD;  Location: [facility];  Service: General;  Laterality: Right;  . RADIOACTIVE SEED GUIDED PARTIAL MASTECTOMY WITH AXILLARY SENTINEL LYMPH NODE BIOPSY Left 03/13/2016   Procedure: LEFT BREAST RADIOACTIVE SEED GUIDED PARTIAL MASTECTOMY WITH AXILLARY SENTINEL LYMPH NODE BIOPSY;  Surgeon: [name], MD;  Location: [facility];  Service: General;  Laterality: Left;  . TUBAL LIGATION       ALLERGIES:  No Known Allergies   CURRENT MEDICATIONS:  Outpatient Encounter Medications as of 01/10/2017  Medication Sig  . albuterol (PROVENTIL HFA;VENTOLIN HFA) 108 (90 Base) MCG/ACT inhaler Inhale 2 puffs into the lungs every 6 (six) hours as needed for wheezing or shortness of breath.  . AMLODIPINE BESYLATE PO Take 10 mg by mouth.  . benzonatate (TESSALON) 100 MG capsule Take 1 capsule (100 mg total) by mouth 2 (two) times daily as needed for cough.  . chlorthalidone (HYGROTON) 25 MG tablet   . cholecalciferol (VITAMIN D) 1000 units tablet Take 1,000 Units by mouth daily.  . ferrous gluconate (FERGON) 324 MG tablet ferrous gluconate 324 mg (38 mg  iron) tablet  Take 1 tablet every day by oral route as directed for 90 days.  . fluticasone (FLONASE) 50 MCG/ACT nasal spray Place into both nostrils daily.  Marland Kitchen levothyroxine (SYNTHROID, LEVOTHROID) 175 MCG tablet   . loratadine (CLARITIN) 10 MG tablet Take 10 mg by mouth daily.  Marland Kitchen losartan (COZAAR) 100 MG tablet   . Naproxen Sodium (ALEVE) 220 MG CAPS Take 1 capsule by mouth. As needed for pain  . potassium chloride 20 MEQ/15ML (10%) SOLN Take 7.5 mLs (10 mEq total) by mouth 2 (two) times daily.  . promethazine (PHENERGAN) 25 MG/ML injection Inject 0.25-0.5 mLs (6.25-12.5 mg total) into the vein every 15 (fifteen) minutes as needed for nausea.  Marland Kitchen levothyroxine (SYNTHROID, LEVOTHROID) 200 MCG tablet Take 200 mcg by mouth daily before breakfast.  . levothyroxine (SYNTHROID, LEVOTHROID) 50 MCG tablet Take 50 mcg by mouth daily before breakfast.  . oxyCODONE (OXY IR/ROXICODONE) 5 MG immediate release tablet Take 1-2 tablets (5-10 mg total) by mouth every 6 (six) hours as needed for severe pain. (Patient not taking: Reported on  01/10/2017)  . potassium chloride (MICRO-K) 10 MEQ CR capsule Take 1 capsule (10 mEq total) by mouth 2 (two) times daily. (Patient not taking: Reported on 01/10/2017)  . valsartan-hydrochlorothiazide (DIOVAN-HCT) 320-25 MG tablet Take 1 tablet by mouth daily.   Facility-Administered Encounter Medications as of 01/10/2017  Medication  . sodium chloride flush (NS) 0.9 % injection 10 mL     ONCOLOGIC FAMILY HISTORY:  Family History  Problem Relation Age of Onset  . Breast cancer Mother 37  . Bladder Cancer Father 61  . Breast cancer Maternal Grandmother 85  . Lung cancer Maternal Grandfather 65       hx smoking  . Hodgkin's lymphoma Maternal Aunt 54     GENETIC COUNSELING/TESTING: Negative, see above  SOCIAL HISTORY:  Lillyonna Armstead is single and lives with her son and boyfriend in [place], [place].  Ms. 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  She denies any current or history of tobacco, alcohol, or illicit drug use.     PHYSICAL EXAMINATION:  Vital Signs:   Vitals:   01/10/17 1108  BP: 138/82  Pulse: 86  Resp: 17  Temp: 98.1 F (36.7 C)  SpO2: 99%   Filed Weights   01/10/17 1108  Weight: 284 lb 9.6 oz (129.1 kg)   General: Well-nourished, well-appearing female in no acute distress.  She is unaccompanied today.   HEENT: Head is normocephalic.  Pupils equal and reactive to light. Conjunctivae clear without exudate.  Sclerae anicteric. Oral mucosa is pink, moist.  Oropharynx is pink without lesions or erythema.  Lymph: No cervical, supraclavicular, or infraclavicular lymphadenopathy noted on palpation.  Cardiovascular: Regular rate and rhythm.Marland Kitchen Respiratory: Clear to auscultation bilaterally. Chest expansion symmetric; breathing non-labored.  Breasts: right breast pendulous, without nodules, masses, skin/nipple changes, left breast is pendulous, radiation changes noted, mild amt of scar tissue present, no nodules, masses GI: Abdomen soft and round; non-tender, non-distended. Bowel sounds normoactive.  GU: Deferred.  Neuro: No focal deficits. Steady gait.  Psych: Mood and affect normal and appropriate for situation.  Extremities: No edema. MSK: No focal spinal tenderness to palpation.  Full range of motion in bilateral upper extremities Skin: Warm and dry.  LABORATORY DATA:  None for this visit.  DIAGNOSTIC IMAGING:  None for this visit.      ASSESSMENT AND PLAN:  Ms.. Pierce is a pleasant 47 y.o. female with Stage IB left breast invasive ductal carcinoma, ER-/PR-/HER2-, diagnosed in 02/2016, treated with lumpectomy, adjuvant chemotherapy, and adjuvant radiation therapy.  She presents to the Survivorship Clinic for our initial meeting and routine follow-up post-completion of treatment for breast cancer.    1. Stage IB left breast cancer:  Ms. Toepfer is continuing to recover from  definitive treatment for breast cancer. She will follow-up with her medical oncologist, Dr. Lindi Adie in 07/2017 with history and physical exam per surveillance protocol. I also ordered her mammogram which is due in 04/2017.  She will be due to f/u with her surgeon, Dr. Brantley Stage in 03/2017. Today, a comprehensive survivorship care plan and treatment summary was reviewed with the patient today detailing her breast cancer diagnosis, treatment course, potential late/long-term effects of treatment, appropriate follow-up care with recommendations for the future, and patient education resources.  A copy of this summary, along with a letter will be sent to the patient's primary care provider via mail/fax/In Basket message after today's visit.    2. Breast swelling: Her left breast is swollen and she  would like to see PT about this. I have placed the referral for evaluation.    3. Bone health:  Given Ms. Chamberlain's history of breast cancer, she is at risk for bone demineralization.  She was given education on specific activities to promote bone health.  4. Cancer screening:  Due to Ms. Goodness's history and her age, she should receive screening for skin cancers, colon cancer, and gynecologic cancers.  The information and recommendations are listed on the patient's comprehensive care plan/treatment summary and were reviewed in detail with the patient.    5. Health maintenance and wellness promotion: Ms. Coley was encouraged to consume 5-7 servings of fruits and vegetables per day. We reviewed the "Nutrition Rainbow" handout, as well as the handout "Take Control of Your Health and Reduce Your Cancer Risk" from the [place] [employer] [place].  She was also encouraged to engage in moderate to vigorous exercise for 30 minutes per day most days of the week. We discussed the [employer] fitness program, which is designed for cancer survivors to help them become more physically fit after cancer treatments.  She was instructed to  limit her alcohol consumption and continue to abstain from tobacco use.     6. Support services/counseling: It is not uncommon for this period of the patient's cancer care trajectory to be one of many emotions and stressors.  We discussed an opportunity for her to participate in the next session of [facility] ("Finding Your New Normal") support group series designed for patients after they have completed treatment.   Ms. Vipond was encouraged to take advantage of our many other support services programs, support groups, and/or counseling in coping with her new life as a cancer survivor after completing anti-cancer treatment.  She was offered support today through active listening and expressive supportive counseling.  She was given information regarding our available services and encouraged to contact me with any questions or for help enrolling in any of our support group/programs.    Dispo:   -Return to cancer center for follow up with Dr. Lindi Adie in 07/2017 -Mammogram due in 03/2017 -Follow up with surgery 03/2017 -She is welcome to return back to the Survivorship Clinic at any time; no additional follow-up needed at this time.  -Consider referral back to survivorship as a long-term survivor for continued surveillance  A total of (30) minutes of face-to-face time was spent with this patient with greater than 50% of that time in counseling and care-coordination.   Gardenia Phlegm, NP Survivorship Program [facility] [phone]   Note: PRIMARY CARE PROVIDER [name], [place] [phone]

## 2017-01-14 ENCOUNTER — Telehealth: Payer: Self-pay

## 2017-01-14 NOTE — Telephone Encounter (Signed)
-----   Message from Gardenia Phlegm, NP sent at 01/14/2017 10:48 AM EST ----- Patient potassium is low.  Please recommend potassium rich diet.  She should have this rechecked in a couple of weeks by her PCP ----- Message ----- From: Interface, Lab In [place] Sent: 01/10/2017  11:00 AM To: Gardenia Phlegm, NP

## 2017-01-14 NOTE — Telephone Encounter (Signed)
Spoke to patient concerning lab results with low potassium.  NP suggest potassium rich diet and return to PCP in a couple of weeks for repeat labs.  Patient voiced understanding and had no questions or concerns at his time.

## 2017-01-22 ENCOUNTER — Encounter: Payer: Self-pay | Admitting: Physical Therapy

## 2017-01-22 ENCOUNTER — Ambulatory Visit: Payer: BLUE CROSS/BLUE SHIELD | Attending: Adult Health | Admitting: Physical Therapy

## 2017-01-22 ENCOUNTER — Other Ambulatory Visit: Payer: Self-pay

## 2017-01-22 DIAGNOSIS — I890 Lymphedema, not elsewhere classified: Secondary | ICD-10-CM

## 2017-01-22 DIAGNOSIS — M25612 Stiffness of left shoulder, not elsewhere classified: Secondary | ICD-10-CM | POA: Insufficient documentation

## 2017-01-22 DIAGNOSIS — R293 Abnormal posture: Secondary | ICD-10-CM | POA: Insufficient documentation

## 2017-01-22 DIAGNOSIS — L599 Disorder of the skin and subcutaneous tissue related to radiation, unspecified: Secondary | ICD-10-CM | POA: Diagnosis present

## 2017-01-22 NOTE — Patient Instructions (Signed)
1. Decompression Exercise     Cancer Rehab [phone]    Lie on back on firm surface, knees bent, feet flat, arms turned up, out to sides, backs of hands down. Time _5-15__ minutes. Surface: floor   2. Shoulder Press    Start in Decompression Exercise position. Press shoulders downward towards supporting surface. Hold __2-3__ seconds while counting out loud. Repeat _3-5___ times. Do _1-2___ times per day.   3. Head Press    Bring cervical spine (neck) into neutral position (by either tucking the chin towards the chest or tilting the chin upward). Feel weight on back of head. Press head downward into supporting surface.    Hold _2-3__ seconds. Repeat _3-5__ times. Do _1-2__ times per day.   4. Leg Lengthener    Straighten one leg. Pull toes AND forefoot toward knee, extend heel. Lengthen leg by pulling pelvis away from ribs. Hold _2-3__ seconds. Relax. Repeat __4-6__ times. Do other leg.  Surface: floor   5. Leg Press    Straighten one leg down to floor keeping leg aligned with hip. Pull toes AND forefoot toward knee; extend heel.  Press entire leg downward (as if pressing leg into sandy beach). DO NOT BEND KNEE. Hold _2-3__ seconds. Do __4-6__ times. Repeat with other leg.      SHOULDER: Flexion - Supine (Cane)        Cancer Rehab [phone]    Hold cane in both hands. Raise arms up overhead. Do not allow back to arch. Hold _5__ seconds. Do __5-10__ times; __1-2__ times a day.   SELF ASSISTED WITH OBJECT: Shoulder Abduction / Adduction - Supine    Hold cane with both hands. Move both arms from side to side, keep elbows straight.  Hold when stretch felt for __5__ seconds. Repeat __5-10__ times; __1-2__ times a day. Once this becomes easier progress to third picture bringing affected arm towards ear by staying out to side. Same hold for _5_seconds. Repeat  _5-10_ times, _1-2_ times/day.   

## 2017-01-22 NOTE — Therapy (Signed)
[place], [place], [address] Phone: [phone]   Fax:  [phone]  Physical Therapy Evaluation  Patient Details  Name: Carla Pierce MRN: [id number] Date of Birth: [birth year] Referring Provider: Gardenia Phlegm    Encounter Date: 01/22/2017  PT End of Session - 01/22/17 1129    Visit Number  1    Number of Visits  9    Date for PT Re-Evaluation  02/22/17    PT Start Time  3716    PT Stop Time  1100    PT Time Calculation (min)  45 min    Activity Tolerance  Patient tolerated treatment well    Behavior During Therapy  [facility] for tasks assessed/performed       Past Medical History:  Diagnosis Date  . Anemia   . Breast cancer ([place])   . 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 Complication of anesthesia    trouble waking up with neck biopsy in 1997-was in ICU  . Family history of bladder cancer   . Family history of breast cancer   . Family history of Hodgkin's lymphoma   . Family history of lung cancer   . Family history of prostate cancer   . GERD (gastroesophageal reflux disease)    uses OTC as needed  . History of head and neck cancer   . Hypertension   . Hypothyroidism   . Seasonal allergies     Past Surgical History:  Procedure Laterality Date  . CHOLECYSTECTOMY    . NECK LESION BIOPSY    . NECK SURGERY Left 1997   for SCC  . PORTACATH PLACEMENT Right 03/13/2016   Procedure: INSERTION PORT-A-CATH;  Surgeon: [name], MD;  Location: [place];  Service: General;  Laterality: Right;  . RADIOACTIVE SEED GUIDED PARTIAL MASTECTOMY WITH AXILLARY SENTINEL LYMPH NODE BIOPSY Left 03/13/2016   Procedure: LEFT BREAST RADIOACTIVE SEED GUIDED PARTIAL MASTECTOMY WITH AXILLARY SENTINEL LYMPH NODE BIOPSY;  Surgeon: [name], MD;  Location: [place];  Service: General;  Laterality: Left;  . TUBAL  LIGATION      There were no vitals filed for this visit.   Subjective Assessment - 01/22/17 1030    Subjective  left breast swelling  that started about 2 months ago.  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  then radiation with skin breakdown. completed in October.  Past history includes a radical neck dissection in 1998 with significant loss of neck and left shoulder ROM and function     Patient Stated Goals  to get rid of the breast swelling     Currently in Pain?  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 She has a well healed scar in left distal axilla.  She has a deep axillary depression due to  forward position of shoulder     Skin Integrity  well healed     Quick DASH   34.09      Sensation   Light Touch  Not tested      Coordination   Gross Motor Movements are Fluid and Coordinated  No pt moves slowly       Posture/Postural Control   Posture/Postural Control  Postural limitations    Postural Limitations  Rounded Shoulders;Forward head    Posture Comments  left shoulder is foward and pt uses compensatory strategies due to decreaesd shoudler ROM       ROM / Strength   AROM / PROM / Strength  AROM      AROM   Right Shoulder Flexion  150 Degrees    Right Shoulder ABduction  150 Degrees    Left Shoulder Flexion  115 Degrees    Left Shoulder ABduction  84 Degrees      Strength   Overall Strength Comments  decreased left scapular stabalization with anterior position of left glenohumeral joint evident overall shoulder strength about 3-/5 on left and 3+/5 on right .       Palpation   Palpation comment  good scar mobility at left axilla with no fibrous areas in breast, very tight scar and fascia at left lateral neck         LYMPHEDEMA/ONCOLOGY QUESTIONNAIRE - 01/22/17 1040      Right Upper Extremity Lymphedema   10 cm Proximal to Olecranon Process  45.5 cm    Olecranon Process  31 cm    15 cm Proximal to Ulnar Styloid Process  31 cm    Just Proximal to Ulnar Styloid Process  19 cm    Across Hand at [employer]  22 cm    At [place] of 2nd Digit  6.5 cm      Left Upper Extremity Lymphedema   10 cm Proximal to Olecranon Process  45.7 cm    Olecranon Process  31.7 cm    15 cm Proximal to Ulnar Styloid Process  32 cm    Just Proximal to Ulnar Styloid Process  19 cm    Across Hand at [employer]  22.5 cm    At [place] of 2nd Digit  6.5 cm        Quick Dash - 01/22/17 0001    Open  a tight or new jar  Mild difficulty    Do heavy household chores (wash walls, wash floors)  Moderate difficulty    Carry a shopping bag or briefcase  Mild difficulty    Wash your back   Moderate difficulty    Use a knife to cut food  No difficulty    Recreational activities in which you take some force or impact through your arm, shoulder, or hand (golf, hammering, tennis)  Moderate difficulty    During the past week, to what extent has your arm, shoulder or hand problem interfered with your normal social activities with family, friends, neighbors, or groups?  Slightly    During the past week, to what extent has your arm, shoulder or hand problem limited your work or other regular daily activities  Slightly    Arm, shoulder, or hand pain.  Mild    Tingling (pins and needles) in your arm, shoulder, or hand  Moderate    Difficulty Sleeping  Moderate difficulty    DASH Score  34.09 %       Objective measurements completed on examination: See above findings.      [facility] Adult PT Treatment/Exercise - 01/22/17 1027      Shoulder Exercises: Supine   Other Supine Exercises  Meeks decompression 3 reps of each     Other Supine Exercises  supine dowel flexion and abduction              PT Education - 01/22/17 1127    Education provided  Yes    Education Details  meeks decompression and supine dowel rod felxion and abduction    Person(s) Educated  Patient    Methods  Explanation;Demonstration;Handout    Comprehension  Verbalized understanding;Returned demonstration          PT Long Term Goals - 01/22/17 1139      PT LONG TERM GOAL #1   Title  Pt will be independent in lymphedema risk reduction practices and use of self manual lymph drainage and compression to manage swelling at home     Time  4    Period  Weeks    Status  New      PT LONG TERM GOAL #2   Title  Pt will be independent in the  use of self manual lymph drainage and compression to manage swelling at home     Time  4    Period  Weeks    Status  New      PT LONG TERM GOAL #3   Title  Pt will be independent in a home exercise program for shoulder ROM and strength     Time  4    Period  Weeks     Status  New             Plan - 01/22/17 1129    Clinical Impression Statement  Pt reports increased left breast swelling and heaviness after raditaion with moist desquamation and occurance of 3 "boils" in breast and axilla that are now healed.  She also has decreased left shoulder and neck ROM and strength from previous surgery.  She does not have signs of arm lymphedema  Will send for prescription for compression bra and recommend pt attend ABC class in addition to instruction in and performance of manual lymph drainage and exercise.     History and Personal Factors relevant to plan of care:  lived in [place] so will have a long way to get to appts  Clinical Presentation  Stable    Clinical Decision Making  Low    Rehab Potential  Good    Clinical Impairments Affecting Rehab Potential  previous radiation and infection in breast     PT Frequency  2x / week    PT Duration  4 weeks    PT Treatment/Interventions  ADLs/Self Care Home Management;DME Instruction;Therapeutic exercise;Therapeutic activities;Patient/family education;Orthotic Fit/Training;Manual techniques;Manual lymph drainage;Compression bandaging;Taping;Passive range of motion;Scar mobilization    PT Next Visit Plan  Teach and perform manual lymph drainage for left breast.  See is script has returned for compression bra, make sure pt is signed up for ABC class, add scapular retraction exercise and progress to supine scapular series,     PT Home Exercise Plan  Meeks decompression, supine dowel rod shoulder flexion and abduction     Recommended Other Services  ABC class     Consulted and Agree with Plan of Care  Patient       Patient will benefit from skilled therapeutic intervention in order to improve the following deficits and impairments:  Pain, Increased fascial restricitons, Postural dysfunction, Decreased range of motion, Decreased strength, Impaired perceived functional ability, Obesity, Decreased knowledge of  precautions, Increased edema, Decreased knowledge of use of DME  Visit Diagnosis: Lymphedema, not elsewhere classified - Plan: PT plan of care cert/re-cert  Disorder of the skin and subcutaneous tissue related to radiation, unspecified - Plan: PT plan of care cert/re-cert  Stiffness of left shoulder, not elsewhere classified - Plan: PT plan of care cert/re-cert  Abnormal posture - Plan: PT plan of care cert/re-cert     Problem List Patient Active Problem List   Diagnosis Date Noted  . Genetic testing 10/11/2016  . Family history of breast cancer   . Family history of Hodgkin's lymphoma   . Family history of prostate cancer   . Family history of bladder cancer   . Port-A-Cath in place 04/12/2016  . Malignant neoplasm of upper-outer quadrant of left breast in female, estrogen receptor negative ([place]) 02/14/2016   Donato Heinz. Owens Shark PT  [place] Levo 01/22/2017, 11:46 AM  [place], [place], [address] Phone: [phone]   Fax:  [phone]  Name: Carla Pierce MRN: [id number] Date of Birth: [birth year]

## 2017-01-23 ENCOUNTER — Ambulatory Visit: Payer: BLUE CROSS/BLUE SHIELD

## 2017-01-23 DIAGNOSIS — I890 Lymphedema, not elsewhere classified: Secondary | ICD-10-CM

## 2017-01-23 DIAGNOSIS — L599 Disorder of the skin and subcutaneous tissue related to radiation, unspecified: Secondary | ICD-10-CM

## 2017-01-23 NOTE — Patient Instructions (Signed)
Self manual lymph drainage:    Cancer Rehab [phone] Perform this sequence once a day.  Only give enough pressure to move your skin, don't slide.  Start with circles near neck above collarbones 10 times.   Diaphragmatic - Supine   Inhale through nose making navel move out toward hands. Exhale through puckered lips, hands follow navel in. Repeat _5__ times. Rest _10__ seconds between repeats.   Copyright  VHI. All rights reserved.  Hug yourself.  Do circles at your neck just above your collarbones.  Repeat this 10 times.  Axilla - One at a Time   Using full weight of flat hand and fingers at center of uninvolved armpit, make _10__ in-place circles.  Now gently stretch skin from the involved side to the uninvolved side across the chest at the shoulder line.  Repeat that 4 times.  LEG: Inguinal Nodes Stimulation   With small finger side of hand against hip crease on involved side, gently perform circles at the crease. Repeat __10_ times.   Copyright  VHI. All rights reserved.  1) Axilla to Inguinal Nodes - Sweep   On involved side, sweep _4__ times from armpit along side of trunk to hip crease.  Draw an imaginary diagonal line from upper outer breast through the nipple area toward lower inner breast.  Direct fluid upward and inward from this line toward the pathway across your upper chest .  Do this in three rows to treat all of the upper inner breast tissue, and do each row 3-4x.      Direct fluid to treat all of lower outer breast tissue downward and outward toward pathway that is aimed at the left groin.  Finish by doing the pathways as described above going from your involved armpit to the same side groin and going across your upper chest from the involved shoulder to the uninvolved shoulder.  Repeat the steps above where you do circles in your left groin and right armpit. Copyright  VHI. All rights reserved.

## 2017-01-23 NOTE — Therapy (Signed)
[place], [place], [address] Phone: [phone]   Fax:  [phone]  Physical Therapy Treatment  Patient Details  Name: Carla Pierce MRN: [id number] Date of Birth: June 28, 1970 Referring Provider: Gardenia Phlegm    Encounter Date: 01/23/2017  PT End of Session - 01/23/17 1021    Visit Number  2    Number of Visits  9    Date for PT Re-Evaluation  02/22/17    PT Start Time  0931    PT Stop Time  1018    PT Time Calculation (min)  47 min    Activity Tolerance  Patient tolerated treatment well    Behavior During Therapy  [facility] for tasks assessed/performed       Past Medical History:  Diagnosis Date  . Anemia   . Breast cancer ([place])   . 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 Complication of anesthesia    trouble waking up with neck biopsy in 1997-was in ICU  . Family history of bladder cancer   . Family history of breast cancer   . Family history of Hodgkin's lymphoma   . Family history of lung cancer   . Family history of prostate cancer   . GERD (gastroesophageal reflux disease)    uses OTC as needed  . History of head and neck cancer   . Hypertension   . Hypothyroidism   . Seasonal allergies     Past Surgical History:  Procedure Laterality Date  . CHOLECYSTECTOMY    . NECK LESION BIOPSY    . NECK SURGERY Left 1997   for SCC  . PORTACATH PLACEMENT Right 03/13/2016   Procedure: INSERTION PORT-A-CATH;  Surgeon: [name], MD;  Location: [place];  Service: General;  Laterality: Right;  . RADIOACTIVE SEED GUIDED PARTIAL MASTECTOMY WITH AXILLARY SENTINEL LYMPH NODE BIOPSY Left 03/13/2016   Procedure: LEFT BREAST RADIOACTIVE SEED GUIDED PARTIAL MASTECTOMY WITH AXILLARY SENTINEL LYMPH NODE BIOPSY;  Surgeon: [name], MD;  Location: [place];  Service: General;  Laterality: Left;  . TUBAL  LIGATION      There were no vitals filed for this visit.  Subjective Assessment - 01/23/17 0934    Subjective  Nothing new since evaluation yesterday.    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  then radiation with skin breakdown. completed in October.  Past history includes a radical neck dissection in 1998 with significant loss of neck and left shoulder ROM and function     Patient Stated Goals  to get rid of the breast swelling     Currently in Pain?  No/denies         [facility] PT Assessment - 01/22/17 1027      Assessment   Medical Diagnosis  left breast cancer     Referring Provider  Wilber Bihari Cornetto     Onset Date/Surgical Date  01/24/16    Hand Dominance  Left      Precautions   Precautions  None      Restrictions   Weight Bearing Restrictions  No      Balance Screen   Has the patient fallen in the past 6 months  -- forgot to ask  [place]  Private residence    Living Arrangements  Children    Available Help at Discharge  Family      Prior Function   Level of [place]  Full time employment    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 She has a well healed scar in left distal axilla.  She has a deep axillary depression due to forward position of shoulder     Skin Integrity  well healed     Quick DASH   34.09      Sensation   Light Touch  Not tested      Coordination    Gross Motor Movements are Fluid and Coordinated  No pt moves slowly       Posture/Postural Control   Posture/Postural Control  Postural limitations    Postural Limitations  Rounded Shoulders;Forward head    Posture Comments  left shoulder is foward and pt uses compensatory strategies due to decreaesd shoudler ROM       ROM / Strength   AROM / PROM / Strength  AROM      AROM   Right Shoulder Flexion  150 Degrees    Right Shoulder ABduction  150 Degrees    Left Shoulder Flexion  115 Degrees    Left Shoulder ABduction  84 Degrees      Strength   Overall Strength Comments  decreased left scapular stabalization with anterior position of left glenohumeral joint evident overall shoulder strength about 3-/5 on left and 3+/5 on right .       Palpation   Palpation comment  good scar mobility at left axilla with no fibrous areas in breast, very tight scar and fascia at left lateral neck         LYMPHEDEMA/ONCOLOGY QUESTIONNAIRE - 01/22/17 1040      Right Upper Extremity Lymphedema   10 cm Proximal to Olecranon Process  45.5 cm    Olecranon Process  31 cm    15 cm Proximal to Ulnar Styloid Process  31 cm    Just Proximal to Ulnar Styloid Process  19 cm    Across Hand at [employer]  22 cm    At [place] of 2nd Digit  6.5 cm      Left Upper Extremity Lymphedema   10 cm Proximal to Olecranon Process  45.7 cm    Olecranon Process  31.7 cm    15 cm Proximal to Ulnar Styloid Process  32 cm    Just Proximal to Ulnar Styloid Process  19 cm    Across Hand at [employer]  22.5 cm    At [place] of 2nd Digit  6.5 cm               [facility] Adult PT Treatment/Exercise - 01/23/17 0001      Manual Therapy   Manual Therapy  Manual Lymphatic Drainage (MLD)    Manual Lymphatic Drainage (MLD)  In Supine: Short neck, superficial and deep abdominals,  Lt inguinla and Rt axillary nodes, Lt axillo-inguinal and anterior inter-axillary anastomosis, then Lt breast redirecting to pathways beginning  to instruct pt throughout.             PT Education - 01/23/17 1021    Education provided  Yes    Education Details  Self manual lymph drainage    Person(s) Educated  Patient    Methods  Explanation;Demonstration;Handout    Comprehension  Verbalized understanding;Returned demonstration;Need further instruction          PT Long Term Goals - 01/22/17 1139      PT LONG TERM GOAL #1   Title  Pt will be independent in lymphedema risk reduction practices and use of self manual lymph drainage and compression to manage swelling at home     Time  4    Period  Weeks    Status  New      PT LONG TERM GOAL #2   Title  Pt will be independent in the  use of self manual lymph drainage and compression to manage swelling at home     Time  4    Period  Weeks    Status  New      PT LONG TERM GOAL #3   Title  Pt will be independent in a home exercise program for shoulder ROM and strength     Time  4    Period  Weeks    Status  New            Plan - 01/23/17 1032    Clinical Impression Statement  Pt tolerated first session of manual lymph drainage to Lt breast well and instructed pt in this issuing handout as well. She returned semi good technique requiring tactile and VCs for less pressure and to not slide. She reports after session that her skin looked less tight and breast was softer.     Rehab Potential  Good    Clinical Impairments Affecting Rehab Potential  previous radiation and infection in breast     PT Frequency  2x / week    PT Duration  4 weeks    PT Treatment/Interventions  ADLs/Self Care Home Management;DME Instruction;Therapeutic exercise;Therapeutic activities;Patient/family education;Orthotic Fit/Training;Manual techniques;Manual lymph drainage;Compression bandaging;Taping;Passive range of motion;Scar mobilization    PT Next Visit Plan  Cont and have pt perform manual lymph drainage for left breast.  See if script has returned for compression bra, make sure pt is  signed up for ABC class, add scapular retraction exercise and progress to supine scapular series,     PT Home Exercise Plan  Meeks decompression, supine dowel rod shoulder flexion and abduction and self MLD to Lt breast    Consulted and Agree with Plan of Care  Patient       Patient will benefit from skilled therapeutic intervention in order to improve the following deficits and impairments:  Pain, Increased fascial restricitons, Postural dysfunction, Decreased range of motion, Decreased strength, Impaired perceived functional ability, Obesity, Decreased knowledge of precautions, Increased edema, Decreased knowledge of use of DME  Visit Diagnosis: Lymphedema, not elsewhere classified  Disorder of the skin and subcutaneous tissue related to radiation, unspecified     Problem List Patient Active Problem List   Diagnosis Date Noted  . Genetic testing 10/11/2016  . Family history of breast cancer   . Family history of Hodgkin's lymphoma   . Family history of prostate cancer   . Family history of bladder cancer   . Port-A-Cath  in place 04/12/2016  . Malignant neoplasm of upper-outer quadrant of left breast in female, estrogen receptor negative ([place]) 02/14/2016    Otelia Limes, PTA 01/23/2017, 10:37 AM  [place], [place], [address] Phone: [phone]   Fax:  [phone]  Name: Berlinda Farve MRN: [id number] Date of Birth: 11-30-1970

## 2017-01-28 ENCOUNTER — Ambulatory Visit: Payer: BLUE CROSS/BLUE SHIELD | Admitting: Physical Therapy

## 2017-01-30 ENCOUNTER — Ambulatory Visit: Payer: BLUE CROSS/BLUE SHIELD

## 2017-01-30 DIAGNOSIS — I890 Lymphedema, not elsewhere classified: Secondary | ICD-10-CM | POA: Diagnosis not present

## 2017-01-30 DIAGNOSIS — R293 Abnormal posture: Secondary | ICD-10-CM

## 2017-01-30 DIAGNOSIS — L599 Disorder of the skin and subcutaneous tissue related to radiation, unspecified: Secondary | ICD-10-CM

## 2017-01-30 DIAGNOSIS — M25612 Stiffness of left shoulder, not elsewhere classified: Secondary | ICD-10-CM

## 2017-01-30 NOTE — Patient Instructions (Signed)

## 2017-01-30 NOTE — Therapy (Signed)
[place], [place], [address] Phone: [phone]   Fax:  [phone]  Physical Therapy Treatment  Patient Details  Name: Carla Pierce MRN: [id number] Date of Birth: 1970/03/21 Referring Provider: Gardenia Phlegm    Encounter Date: 01/30/2017  PT End of Session - 01/30/17 1017    Visit Number  3    Number of Visits  9    Date for PT Re-Evaluation  02/22/17    PT Start Time  0936    PT Stop Time  1017    PT Time Calculation (min)  41 min    Activity Tolerance  Patient tolerated treatment well    Behavior During Therapy  [facility] for tasks assessed/performed       Past Medical History:  Diagnosis Date  . Anemia   . Breast cancer ([place])   . 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 Complication of anesthesia    trouble waking up with neck biopsy in 1997-was in ICU  . Family history of bladder cancer   . Family history of breast cancer   . Family history of Hodgkin's lymphoma   . Family history of lung cancer   . Family history of prostate cancer   . GERD (gastroesophageal reflux disease)    uses OTC as needed  . History of head and neck cancer   . Hypertension   . Hypothyroidism   . Seasonal allergies     Past Surgical History:  Procedure Laterality Date  . CHOLECYSTECTOMY    . NECK LESION BIOPSY    . NECK SURGERY Left 1997   for SCC  . PORTACATH PLACEMENT Right 03/13/2016   Procedure: INSERTION PORT-A-CATH;  Surgeon: [name], MD;  Location: [facility];  Service: General;  Laterality: Right;  . RADIOACTIVE SEED GUIDED PARTIAL MASTECTOMY WITH AXILLARY SENTINEL LYMPH NODE BIOPSY Left 03/13/2016   Procedure: LEFT BREAST RADIOACTIVE SEED GUIDED PARTIAL MASTECTOMY WITH AXILLARY SENTINEL LYMPH NODE BIOPSY;  Surgeon: [name], MD;  Location: [facility];  Service: General;  Laterality: Left;  . TUBAL  LIGATION      There were no vitals filed for this visit.  Subjective Assessment - 01/30/17 0941    Subjective  I think the breast is doing some better. I've tried the massage some since I was here last and I think I did okay but I want you to check how I'm doing it.     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  then radiation with skin breakdown. completed in October.  Past history includes a radical neck dissection in 1998 with significant loss of neck and left shoulder ROM and function     Patient Stated Goals  to get rid of the breast swelling     Currently in Pain?  No/denies                      [facility] Adult PT Treatment/Exercise - 01/30/17 0001      Shoulder Exercises: Supine   Horizontal ABduction  Strengthening;Both;10 reps;Theraband    Theraband Level (Shoulder Horizontal ABduction)  Level 1 (Yellow)    External Rotation  Strengthening;Both;10 reps;Theraband    Theraband Level (Shoulder External Rotation)  Level 1 (Yellow)  Flexion  Strengthening;Both;10 reps;Theraband Narrow and Wide Grip 5, times each    Theraband Level (Shoulder Flexion)  Level 1 (Yellow)    Other Supine Exercises  Bil D2 with yellow theraband 5 times returning correct therapist demonstration for all above exercises.      Manual Therapy   Manual Therapy  Manual Lymphatic Drainage (MLD)    Manual Lymphatic Drainage (MLD)  In Supine: Short neck, superficial and deep abdominals, Lt inguinla and Rt axillary nodes, Lt axillo-inguinal and anterior inter-axillary anastomosis, then Lt breast redirecting to pathways, then into Rt S/L for continued Lt axillo-inguinal and then posterior inter-axillary anastomosis then finishing in supine retracing pathways, reviewing with pt throughout and having her perform to assess technique.               PT Education - 01/30/17 1017    Education provided  Yes    Education Details  Supine scapular series with yellow theraband    Person(s) Educated  Patient    Methods  Explanation;Demonstration;Handout    Comprehension  Verbalized understanding;Returned demonstration          PT Long Term Goals - 01/22/17 1139      PT LONG TERM GOAL #1   Title  Pt will be independent in lymphedema risk reduction practices and use of self manual lymph drainage and compression to manage swelling at home     Time  4    Period  Weeks    Status  New      PT LONG TERM GOAL #2   Title  Pt will be independent in the  use of self manual lymph drainage and compression to manage swelling at home     Time  4    Period  Weeks    Status  New      PT LONG TERM GOAL #3   Title  Pt will be independent in a home exercise program for shoulder ROM and strength     Time  4    Period  Weeks    Status  New            Plan - 01/30/17 1017    Clinical Impression Statement  Pt did well with review of self MLD only needing min tactile cues and hand over hand pressure for correct instructiono of lighter pressure. She was able to return correct demonstration after a few attempts. also instructed in and progressed HEP to include supine scapular series which pt tolerated very well.     Rehab Potential  Good    Clinical Impairments Affecting Rehab Potential  previous radiation and infection in breast     PT Frequency  2x / week    PT Duration  4 weeks    PT Treatment/Interventions  ADLs/Self Care Home Management;DME Instruction;Therapeutic exercise;Therapeutic activities;Patient/family education;Orthotic Fit/Training;Manual techniques;Manual lymph drainage;Compression bandaging;Taping;Passive range of motion;Scar mobilization    PT Next Visit Plan  Cont and have pt perform manual lymph drainage for left breast.  See if script has returned for compression bra, make sure pt is signed up for ABC class, add  scapular retraction exercise and review supine scapular series,     Consulted and Agree with Plan of Care  Patient       Patient will benefit from skilled therapeutic intervention in order to improve the following deficits and impairments:  Pain, Increased fascial restricitons, Postural dysfunction, Decreased range of motion, Decreased strength, Impaired perceived functional ability, Obesity, Decreased knowledge of precautions, Increased edema, Decreased knowledge  of use of DME  Visit Diagnosis: Lymphedema, not elsewhere classified  Disorder of the skin and subcutaneous tissue related to radiation, unspecified  Stiffness of left shoulder, not elsewhere classified  Abnormal posture     Problem List Patient Active Problem List   Diagnosis Date Noted  . Genetic testing 10/11/2016  . Family history of breast cancer   . Family history of Hodgkin's lymphoma   . Family history of prostate cancer   . Family history of bladder cancer   . Port-A-Cath in place 04/12/2016  . Malignant neoplasm of upper-outer quadrant of left breast in female, estrogen receptor negative ([place]) 02/14/2016    Otelia Limes, PTA 01/30/2017, 10:20 AM  [place], [place], [address] Phone: [phone]   Fax:  [phone]  Name: Carla Pierce MRN: [id number] Date of Birth: 11-14-1970

## 2017-02-04 ENCOUNTER — Ambulatory Visit: Payer: BLUE CROSS/BLUE SHIELD | Attending: Adult Health | Admitting: Physical Therapy

## 2017-02-04 ENCOUNTER — Encounter: Payer: Self-pay | Admitting: Physical Therapy

## 2017-02-04 DIAGNOSIS — I890 Lymphedema, not elsewhere classified: Secondary | ICD-10-CM | POA: Insufficient documentation

## 2017-02-04 NOTE — Therapy (Addendum)
[place], [place], [address] Phone: [phone]   Fax:  [phone]  Physical Therapy Treatment  Patient Details  Name: Carla Pierce MRN: [id number] Date of Birth: August 28, 1970 Referring Provider: Gardenia Phlegm    Encounter Date: 02/04/2017  PT End of Session - 02/04/17 1214    Visit Number  4    Number of Visits  9    Date for PT Re-Evaluation  02/22/17    PT Start Time  1017    PT Stop Time  1100    PT Time Calculation (min)  43 min    Activity Tolerance  Patient tolerated treatment well    Behavior During Therapy  [facility] for tasks assessed/performed       Past Medical History:  Diagnosis Date  . Anemia   . Breast cancer ([place])   . 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 Complication of anesthesia    trouble waking up with neck biopsy in 1997-was in ICU  . Family history of bladder cancer   . Family history of breast cancer   . Family history of Hodgkin's lymphoma   . Family history of lung cancer   . Family history of prostate cancer   . GERD (gastroesophageal reflux disease)    uses OTC as needed  . History of head and neck cancer   . Hypertension   . Hypothyroidism   . Seasonal allergies     Past Surgical History:  Procedure Laterality Date  . CHOLECYSTECTOMY    . NECK LESION BIOPSY    . NECK SURGERY Left 1997   for SCC  . PORTACATH PLACEMENT Right 03/13/2016   Procedure: INSERTION PORT-A-CATH;  Surgeon: [name], MD;  Location: [facility];  Service: General;  Laterality: Right;  . RADIOACTIVE SEED GUIDED PARTIAL MASTECTOMY WITH AXILLARY SENTINEL LYMPH NODE BIOPSY Left 03/13/2016   Procedure: LEFT BREAST RADIOACTIVE SEED GUIDED PARTIAL MASTECTOMY WITH AXILLARY SENTINEL LYMPH NODE BIOPSY;  Surgeon: [name], MD;  Location: [facility];  Service: General;  Laterality: Left;  . TUBAL  LIGATION      There were no vitals filed for this visit.  Subjective Assessment - 02/04/17 1021    Subjective  I think the breast swelling is better. I have been doing the massage at home.     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  then radiation with skin breakdown. completed in October.  Past history includes a radical neck dissection in 1998 with significant loss of neck and left shoulder ROM and function     Patient Stated Goals  to get rid of the breast swelling     Currently in Pain?  No/denies                      [facility] Adult PT Treatment/Exercise - 02/04/17 0001      Manual Therapy   Manual Therapy  Manual Lymphatic Drainage (MLD)    Manual Lymphatic Drainage (MLD)  In Supine: Short neck, superficial and deep abdominals, Lt inguinla and Rt axillary nodes, Lt axillo-inguinal and anterior inter-axillary anastomosis, then Lt breast redirecting to pathways, then into Rt S/L for continued Lt axillo-inguinal and then posterior inter-axillary anastomosis then finishing in  supine retracing pathways                  PT Long Term Goals - 01/22/17 1139      PT LONG TERM GOAL #1   Title  Pt will be independent in lymphedema risk reduction practices and use of self manual lymph drainage and compression to manage swelling at home     Time  4    Period  Weeks    Status  New      PT LONG TERM GOAL #2   Title  Pt will be independent in the  use of self manual lymph drainage and compression to manage swelling at home     Time  4    Period  Weeks    Status  New      PT LONG TERM GOAL #3   Title  Pt will be independent in a home exercise program for shoulder ROM and strength     Time  4    Period  Weeks    Status  New            Plan - 02/04/17 1214    Clinical Impression Statement   Continued with L breast MLD and gave pt prescription to obtain compression bra from DME supplier.     Rehab Potential  Good    Clinical Impairments Affecting Rehab Potential  previous radiation and infection in breast     PT Frequency  2x / week    PT Duration  4 weeks    PT Treatment/Interventions  ADLs/Self Care Home Management;DME Instruction;Therapeutic exercise;Therapeutic activities;Patient/family education;Orthotic Fit/Training;Manual techniques;Manual lymph drainage;Compression bandaging;Taping;Passive range of motion;Scar mobilization    PT Next Visit Plan  Cont and have pt perform manual lymph drainage for left breast.  See if pt was measured for compression bra, make sure pt is signed up for ABC class, add scapular retraction exercise and review supine scapular series,     PT Home Exercise Plan  Meeks decompression, supine dowel rod shoulder flexion and abduction and self MLD to Lt breast    Consulted and Agree with Plan of Care  Patient       Patient will benefit from skilled therapeutic intervention in order to improve the following deficits and impairments:  Pain, Increased fascial restricitons, Postural dysfunction, Decreased range of motion, Decreased strength, Impaired perceived functional ability, Obesity, Decreased knowledge of precautions, Increased edema, Decreased knowledge of use of DME  Visit Diagnosis: Lymphedema, not elsewhere classified     Problem List Patient Active Problem List   Diagnosis Date Noted  . Genetic testing 10/11/2016  . Family history of breast cancer   . Family history of Hodgkin's lymphoma   . Family history of prostate cancer   . Family history of bladder cancer   . Port-A-Cath in place 04/12/2016  . Malignant neoplasm of upper-outer quadrant of left breast in female, estrogen receptor negative ([place]) 02/14/2016    Allyson Sabal [facility] 02/04/2017, 12:19 PM  [place], [place], [address] Phone: [phone]   Fax:  [phone]  Name: Carla Pierce MRN: [id number] Date of Birth: Sep 11, 1970  Manus Gunning, PT 02/04/17 12:19 PM   PHYSICAL THERAPY DISCHARGE SUMMARY  Visits from Start of Care: 4  Current functional level related to goals / functional outcomes: See above   Remaining deficits: See above   Education / Equipment: See above  Plan: Patient agrees to discharge.  Patient goals were  not met. Patient is being discharged due to not returning since the last visit.  ?????    Allyson Sabal [place], [place] 04/18/17 11:27 AM

## 2017-02-04 NOTE — Patient Instructions (Signed)
First of all, check with your insurance company to see if provider is in network    [facility]   (for wigs and compression sleeves / gloves/gauntlets )  [address] [place], [place] [address] [phone]  Will file some insurances --- call for appointment   [facility] (for mastectomy prosthetics and garments) [address] [place], [place] [address] [phone] Will file some insurances --- call for appointment  [facility]  [address]  [place], [place] [address] [phone] Lower extremity garments  [facility] [address] [place], [place]  [address] [phone]  [employer] Sales rep:  Matt Lawson:  [phone] [email or web address] Biocompression pumps   [employer] [employer]  Sales rep: Robert Rollins:  [phone] [email or web address] Entre and Flexitouch pumps    Other Resources: National Lymphedema Network:  www.lymphnet.org www.[email or web address] for patient articles and purchase a self manual lymph drainage DVD [email or web address] has informative articles.  

## 2017-02-06 ENCOUNTER — Ambulatory Visit: Payer: BLUE CROSS/BLUE SHIELD | Admitting: Physical Therapy

## 2017-02-11 ENCOUNTER — Ambulatory Visit: Payer: BLUE CROSS/BLUE SHIELD

## 2017-02-12 ENCOUNTER — Ambulatory Visit
Admission: RE | Admit: 2017-02-12 | Discharge: 2017-02-12 | Disposition: A | Discharge status: Home / Self Care | Payer: BLUE CROSS/BLUE SHIELD | Source: Ambulatory Visit | Attending: Adult Health | Admitting: Adult Health

## 2017-02-12 DIAGNOSIS — Z171 Estrogen receptor negative status [ER-]: Principal | ICD-10-CM

## 2017-02-12 DIAGNOSIS — C50412 Malignant neoplasm of upper-outer quadrant of left female breast: Secondary | ICD-10-CM

## 2017-02-19 ENCOUNTER — Ambulatory Visit: Payer: BLUE CROSS/BLUE SHIELD | Admitting: Physical Therapy

## 2017-02-19 ENCOUNTER — Telehealth: Payer: Self-pay | Admitting: Physical Therapy

## 2017-02-19 NOTE — Telephone Encounter (Signed)
Pt did not show up for PT appointment today.  Called and left a message for her to call us to discuss if she is doing okay or needs to return. She has had 4 visits and authorization period is up on 02/22/2017   Donato Heinz. Owens Shark, PT 02/19/2017 11:18 AM

## 2017-07-10 NOTE — Assessment & Plan Note (Signed)
Left lumpectomy 03/08/2016: IDC 2 cm, margins negative, 0/2 lymph nodes, grade 3, ER 0%, PR 0%, HER-2 negative ratio 1.31, Ki-67 40%, T1 CN 0 stage IA  Treatment plan: 1. Adjuvant chemotherapy with dose dense Adriamycin and Cytoxan 4 followed by weekly Taxol 7 started 04/17/2016- 07/03/16 2. Followed by radiation therapy ----------------------------------------------------------------------------------------------------------------------------------------- Breast cancer surveillance 1. Breast exam 2. Mammogram  RTC in 1 year

## 2017-07-11 ENCOUNTER — Telehealth: Payer: Self-pay | Admitting: Hematology and Oncology

## 2017-07-11 ENCOUNTER — Inpatient Hospital Stay: Payer: BLUE CROSS/BLUE SHIELD | Attending: Hematology and Oncology | Admitting: Hematology and Oncology

## 2017-07-11 DIAGNOSIS — Z923 Personal history of irradiation: Secondary | ICD-10-CM

## 2017-07-11 DIAGNOSIS — C50412 Malignant neoplasm of upper-outer quadrant of left female breast: Secondary | ICD-10-CM | POA: Insufficient documentation

## 2017-07-11 DIAGNOSIS — Z171 Estrogen receptor negative status [ER-]: Secondary | ICD-10-CM | POA: Insufficient documentation

## 2017-07-11 DIAGNOSIS — Z9221 Personal history of antineoplastic chemotherapy: Secondary | ICD-10-CM | POA: Diagnosis not present

## 2017-07-11 NOTE — Progress Notes (Signed)
Patient Care Team: [name], DO as PCP - General (Family Medicine) [name], MD as Consulting Physician (General Surgery) [name], MD as Consulting Physician (Hematology and Oncology) [name], MD as Consulting Physician (Radiation Oncology) [name], NP as Nurse Practitioner (Hematology and Oncology) Jamey Reas, MD as Referring Physician (Specialist)  DIAGNOSIS:  Encounter Diagnosis  Name Primary?  . Malignant neoplasm of upper-outer quadrant of left breast in female, estrogen receptor negative ([place])     SUMMARY OF ONCOLOGIC HISTORY:   Malignant neoplasm of upper-outer quadrant of left breast in female, estrogen receptor negative ([place])   03/20/1995 Initial Biopsy    Head and Neck Cancer      02/09/2016 Initial Diagnosis    Left breast biopsy 2:00: IDC, left axillary lymph node biopsy negative, grade 3, ER 0%, PR 0%, Ki-67 40%, HER-2 negative ratio 1.31, screening detected left breast mass 1.8 cm, left axillary lymph node 5 mm benign, T1 CN 0 stage IA clinical stage      03/13/2016 Surgery    Left lumpectomy: IDC 2 cm, margins negative, 0/2 lymph nodes, grade 3, ER 0%, PR 0%, HER-2 negative ratio 1.31, Ki-67 40%, T1 CN 0 stage IA      04/10/2016 - 07/03/2016 Chemotherapy    Adjuvant chemotherapy with dose dense Adriamycin and Cytoxan followed by Taxol weekly 7, 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 (received treatment in [place]).      10/10/2016 Genetic Testing    Genetic testing did not reveal a pathogenic mutation.  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 STK11, TP53, TSC1, TSC2, and VHL.  The following genes were evaluated for sequence changes only: SDHA and HOXB13 c.251G>A variant only.            CHIEF COMPLIANT: Follow-up of triple negative breast cancer on surveillance   INTERVAL HISTORY: Carla Pierce is a 83-year with above-mentioned history of triple negative breast cancer treated with lumpectomy followed by adjuvant chemotherapy and radiation.  She is here for a follow-up visit.  She reports pain in the right knee as well as right lower back probably is because of her gait and knee issues.  She is seeing an orthopedic specialist for the knee.  She is going to physical therapy which has not helped her.  REVIEW OF SYSTEMS:   Constitutional: Denies fevers, chills or abnormal weight loss Eyes: Denies blurriness of vision Ears, nose, mouth, throat, and face: Denies mucositis or sore throat Respiratory: Denies cough, dyspnea or wheezes Cardiovascular: Denies palpitation, chest discomfort Gastrointestinal:  Denies nausea, heartburn or change in bowel habits Skin: Denies abnormal skin rashes Lymphatics: Denies new lymphadenopathy or easy bruising Neurological:Denies numbness, tingling or new weaknesses Behavioral/Psych: Mood is stable, no new changes  Extremities: Right knee swelling from arthritis All other systems were reviewed with the patient and are negative.  I have reviewed the past medical history, past surgical history, social history and family history with the patient and  they are unchanged from previous note.  ALLERGIES:  has No Known Allergies.  MEDICATIONS:  Current Outpatient Medications  Medication Sig Dispense Refill  . albuterol (PROVENTIL HFA;VENTOLIN HFA) 108 (90 Base) MCG/ACT inhaler Inhale 2 puffs into  the lungs every 6 (six) hours as needed for wheezing or shortness of breath. 1 Inhaler 2  . AMLODIPINE BESYLATE PO Take 10 mg by mouth.    . benzonatate (TESSALON) 100 MG capsule Take 1 capsule (100 mg total) by mouth 2 (two) times daily as needed for cough. 20 capsule 0  . chlorthalidone (HYGROTON) 25 MG tablet     . cholecalciferol (VITAMIN D) 1000 units tablet Take 1,000 Units by mouth daily.    . ferrous gluconate (FERGON) 324 MG tablet ferrous gluconate 324 mg (38 mg iron) tablet  Take 1 tablet every day by oral route as directed for 90 days.    . fluticasone (FLONASE) 50 MCG/ACT nasal spray Place into both nostrils daily.    Marland Kitchen levothyroxine (SYNTHROID, LEVOTHROID) 175 MCG tablet     . levothyroxine (SYNTHROID, LEVOTHROID) 200 MCG tablet Take 200 mcg by mouth daily before breakfast.    . levothyroxine (SYNTHROID, LEVOTHROID) 50 MCG tablet Take 50 mcg by mouth daily before breakfast.    . loratadine (CLARITIN) 10 MG tablet Take 10 mg by mouth daily.    Marland Kitchen losartan (COZAAR) 100 MG tablet     . Naproxen Sodium (ALEVE) 220 MG CAPS Take 1 capsule by mouth. As needed for pain    . oxyCODONE (OXY IR/ROXICODONE) 5 MG immediate release tablet Take 1-2 tablets (5-10 mg total) by mouth every 6 (six) hours as needed for severe pain. (Patient not taking: Reported on 01/10/2017) 20 tablet 0  . potassium chloride (MICRO-K) 10 MEQ CR capsule Take 1 capsule (10 mEq total) by mouth 2 (two) times daily. (Patient not taking: Reported on 01/10/2017) 30 capsule 1  . potassium chloride 20 MEQ/15ML (10%) SOLN Take 7.5 mLs (10 mEq total) by mouth 2 (two) times daily. 240 mL 1  . promethazine (PHENERGAN) 25 MG/ML injection Inject 0.25-0.5 mLs (6.25-12.5 mg total) into the vein every 15 (fifteen) minutes as needed for nausea. 1 mL 0  . valsartan-hydrochlorothiazide (DIOVAN-HCT) 320-25 MG tablet Take 1 tablet by mouth daily.     No current facility-administered medications for this visit.    Facility-Administered  Medications Ordered in Other Visits  Medication Dose Route Frequency Provider Last Rate Last Dose  . sodium chloride flush (NS) 0.9 % injection 10 mL  10 mL Intravenous PRN [name], MD   10 mL at 04/12/16 1512    PHYSICAL EXAMINATION: ECOG PERFORMANCE STATUS: 1 - Symptomatic but completely ambulatory  Vitals:   07/11/17 1002  BP: 126/82  Pulse: 67  Resp: 18  Temp: 97.9 F (36.6 C)  SpO2: 98%   Filed Weights   07/11/17 1002  Weight: 290 lb 12.8 oz (131.9 kg)    GENERAL:alert, no distress and comfortable SKIN: skin color, texture, turgor are normal, no rashes or significant lesions EYES: normal, Conjunctiva are pink and non-injected, sclera clear OROPHARYNX:no exudate, no erythema and lips, buccal mucosa, and tongue normal  NECK: supple, thyroid normal size, non-tender, without nodularity LYMPH:  no palpable lymphadenopathy in the cervical, axillary or inguinal LUNGS: clear to auscultation and percussion with normal breathing effort HEART: regular rate & rhythm and no murmurs and no lower extremity edema ABDOMEN:abdomen soft, non-tender and normal bowel sounds MUSCULOSKELETAL:no cyanosis of digits and no clubbing  NEURO: alert &  oriented x 3 with fluent speech, no focal motor/sensory deficits EXTREMITIES: No lower extremity edema BREAST: No palpable masses or nodules in either right or left breasts. No palpable axillary supraclavicular or infraclavicular adenopathy no breast tenderness or nipple discharge. (exam performed in the presence of a chaperone)  LABORATORY DATA:  I have reviewed the data as listed CMP Latest Ref Rng & Units 01/10/2017 07/10/2016 07/03/2016  Glucose 70 - 140 mg/dL 87 97 101  BUN 7 - 26 mg/dL 13 15.3 14.7  Creatinine 0.60 - 1.10 mg/dL 0.87 0.8 0.9  Sodium 136 - 145 mmol/L 142 141 144  Potassium 3.3 - 4.7 mmol/L 3.1(L) 3.6 3.6  Chloride 98 - 109 mmol/L 101 - -  CO2 22 - 29 mmol/L 33(H) 29 29  Calcium 8.4 - 10.4 mg/dL 9.5 9.1 9.3  Total Protein 6.4  - 8.3 g/dL 7.1 6.6 6.6  Total Bilirubin 0.2 - 1.2 mg/dL 0.4 0.33 0.28  Alkaline Phos 40 - 150 U/L 102 88 76  AST 5 - 34 U/L 38(H) 43(H) 40(H)  ALT 0 - 55 U/L 19 33 32    Lab Results  Component Value Date   WBC 5.5 01/10/2017   HGB 12.1 01/10/2017   HCT 37.4 01/10/2017   MCV 80.7 01/10/2017   PLT 281 01/10/2017   NEUTROABS 4.0 01/10/2017    ASSESSMENT & PLAN:  Malignant neoplasm of upper-outer quadrant of left breast in female, estrogen receptor negative ([place]) Left lumpectomy 03/08/2016: IDC 2 cm, margins negative, 0/2 lymph nodes, grade 3, ER 0%, PR 0%, HER-2 negative ratio 1.31, Ki-67 40%, T1 CN 0 stage IA  Treatment plan: 1. Adjuvant chemotherapy with dose dense Adriamycin and Cytoxan 4 followed by weekly Taxol 7 started 04/17/2016- 07/03/16 2. Followed by radiation therapy in [place] completed: September 2018 ----------------------------------------------------------------------------------------------------------------------------------------- Breast cancer surveillance 1. Breast exam 07/11/2017: No palpable lumps or nodules of concern 2. Mammogram 02/12/2017: Benign, breast density category B  RTC in 1 year  No orders of the defined types were placed in this encounter.  The patient has a good understanding of the overall plan. she agrees with it. she will call with any problems that may develop before the next visit here.   Harriette Ohara, MD 07/11/17

## 2017-07-11 NOTE — Telephone Encounter (Signed)
Gave avs and calendar ° °

## 2017-09-25 ENCOUNTER — Telehealth: Payer: Self-pay | Admitting: Hematology and Oncology

## 2017-09-25 NOTE — Telephone Encounter (Signed)
Faxed records to [facility] release id [id number]

## 2017-10-03 ENCOUNTER — Telehealth: Payer: Self-pay | Admitting: *Deleted

## 2017-10-03 NOTE — Telephone Encounter (Signed)
On 10-03-17 fax medical records to [facility] , it was consult note

## 2018-01-14 ENCOUNTER — Other Ambulatory Visit: Payer: Self-pay | Admitting: Hematology and Oncology

## 2018-01-14 DIAGNOSIS — Z853 Personal history of malignant neoplasm of breast: Secondary | ICD-10-CM

## 2018-02-13 ENCOUNTER — Ambulatory Visit
Admission: RE | Admit: 2018-02-13 | Discharge: 2018-02-13 | Disposition: A | Discharge status: Home / Self Care | Payer: BLUE CROSS/BLUE SHIELD | Source: Ambulatory Visit | Attending: Hematology and Oncology | Admitting: Hematology and Oncology

## 2018-02-13 DIAGNOSIS — Z853 Personal history of malignant neoplasm of breast: Secondary | ICD-10-CM

## 2018-04-25 IMAGING — MG DIGITAL DIAGNOSTIC BILATERAL MAMMOGRAM WITH TOMO AND CAD
8 of 13 series · 8 of 37 positions shown · non-contrast
Comparison: 03/13/2016 and earlier

CLINICAL DATA: Left lumpectomy with radiation and chemotherapy in
March 2016.

EXAM:
2D DIGITAL DIAGNOSTIC BILATERAL MAMMOGRAM WITH CAD AND ADJUNCT TOMO

[L MLO]
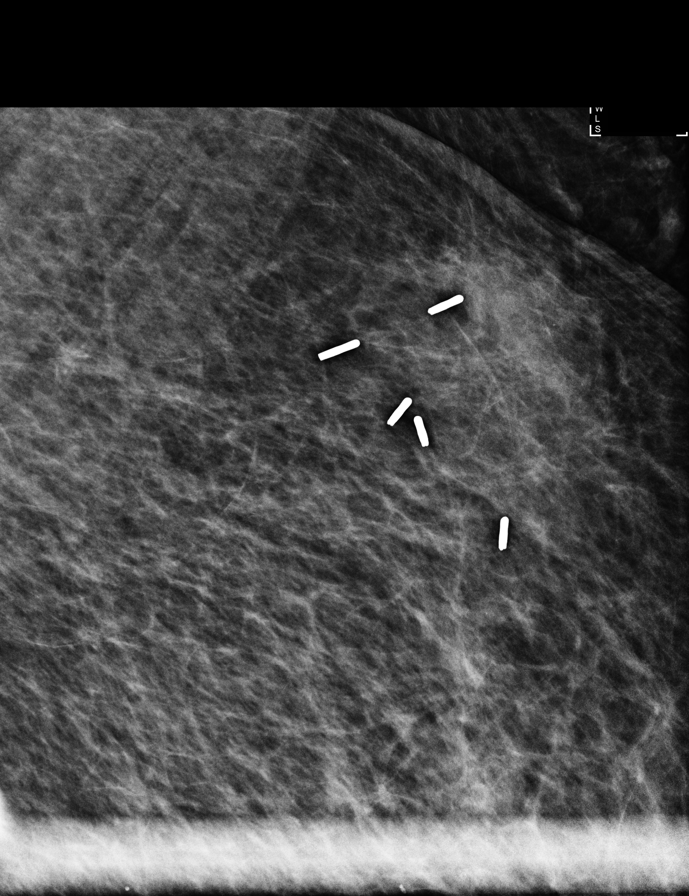

[R MLO synth-2D (1 of 2)]
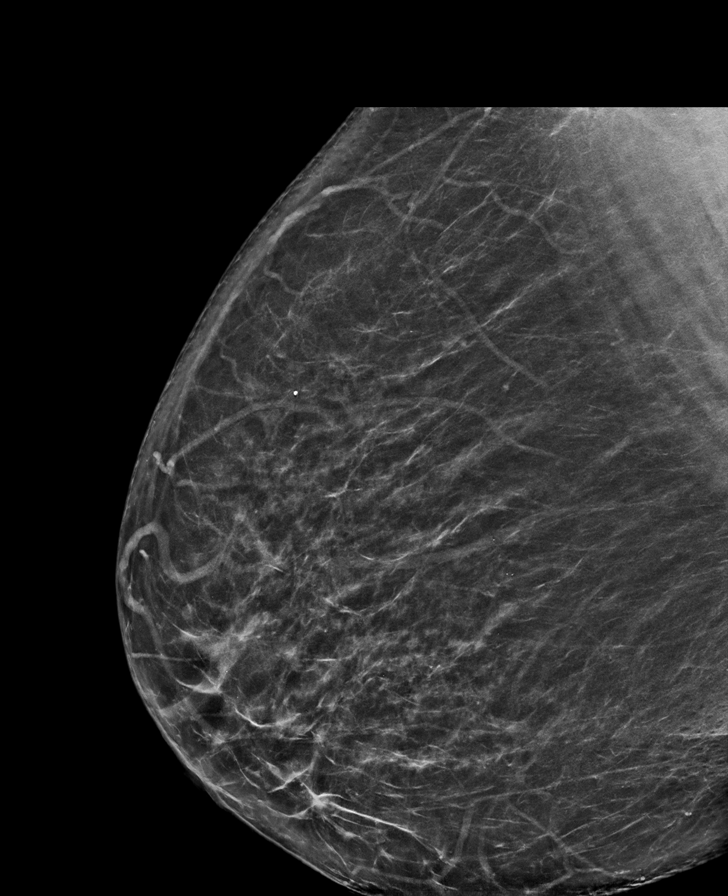

[L CC synth-2D]
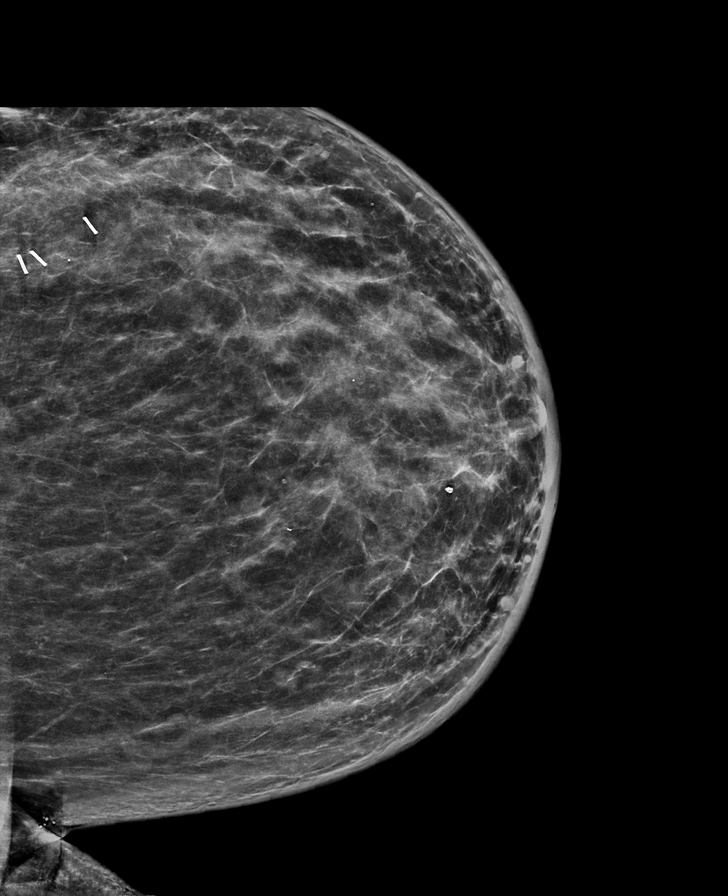

[R CC synth-2D (1 of 2)]
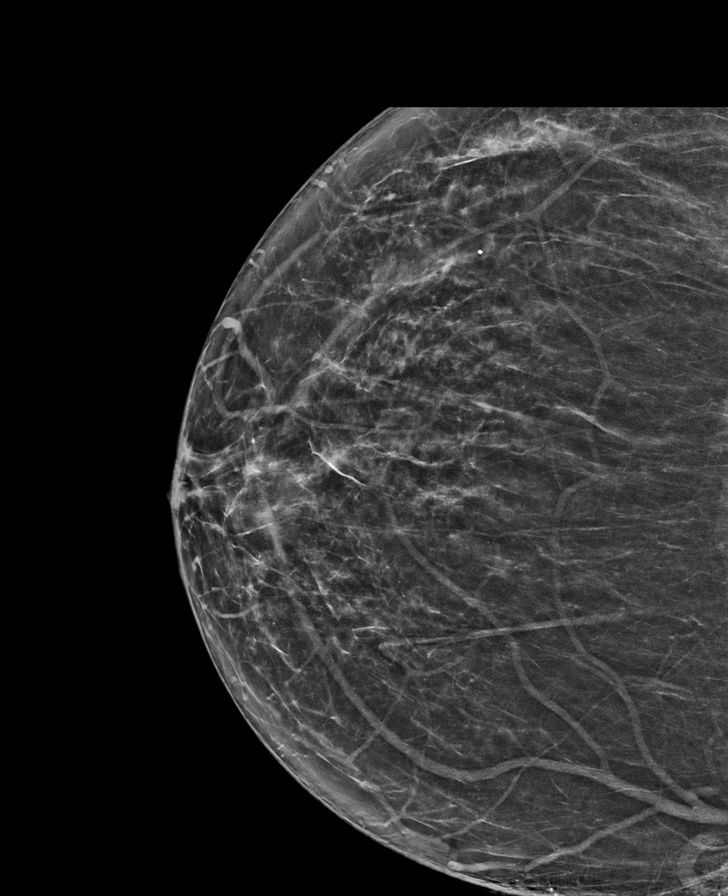

[R CC synth-2D (2 of 2)]
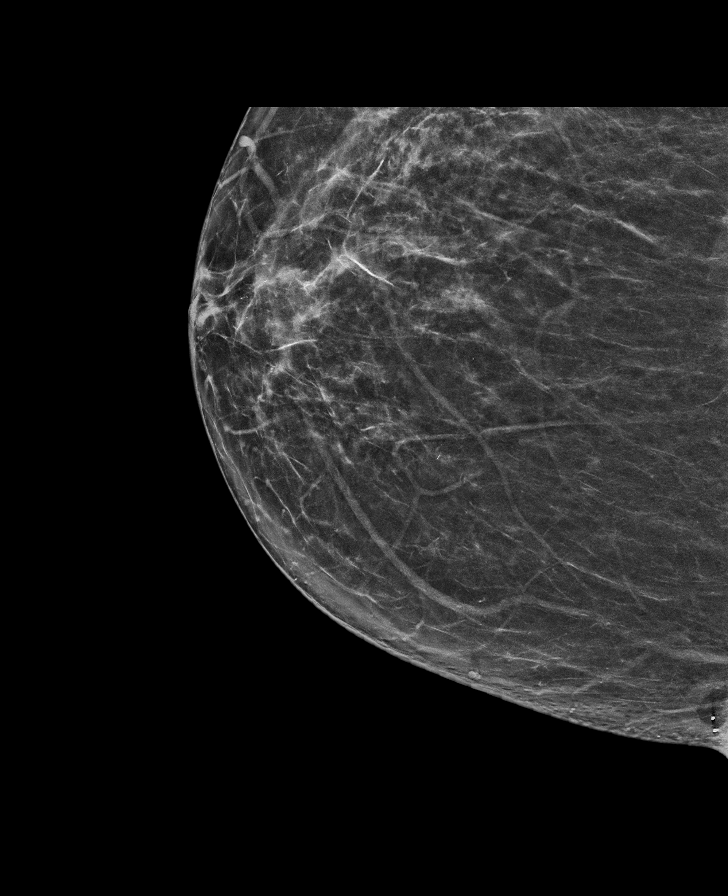

[R MLO synth-2D (2 of 2)]
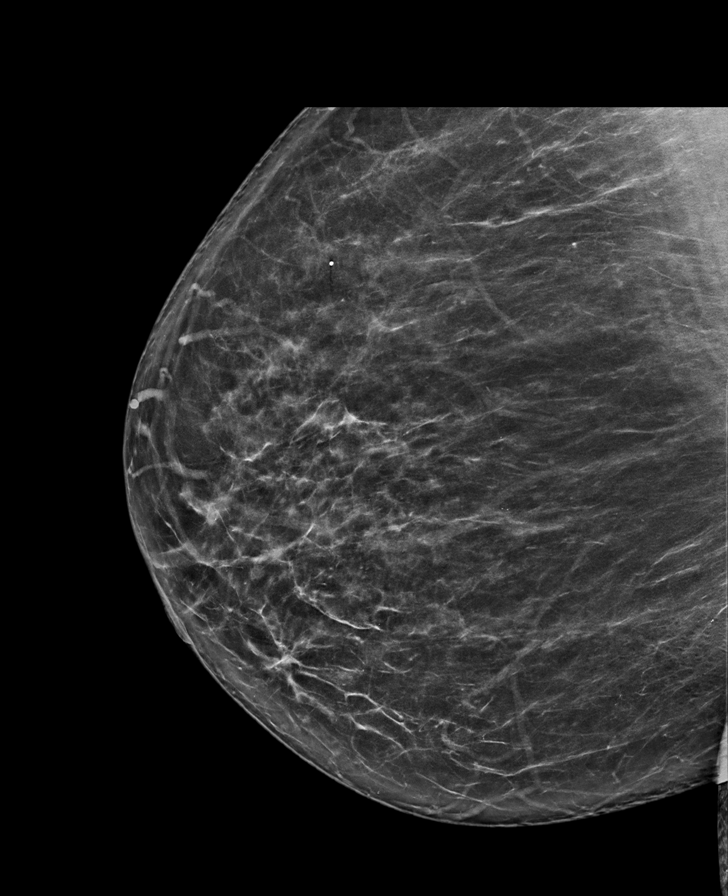

[L MLO synth-2D]
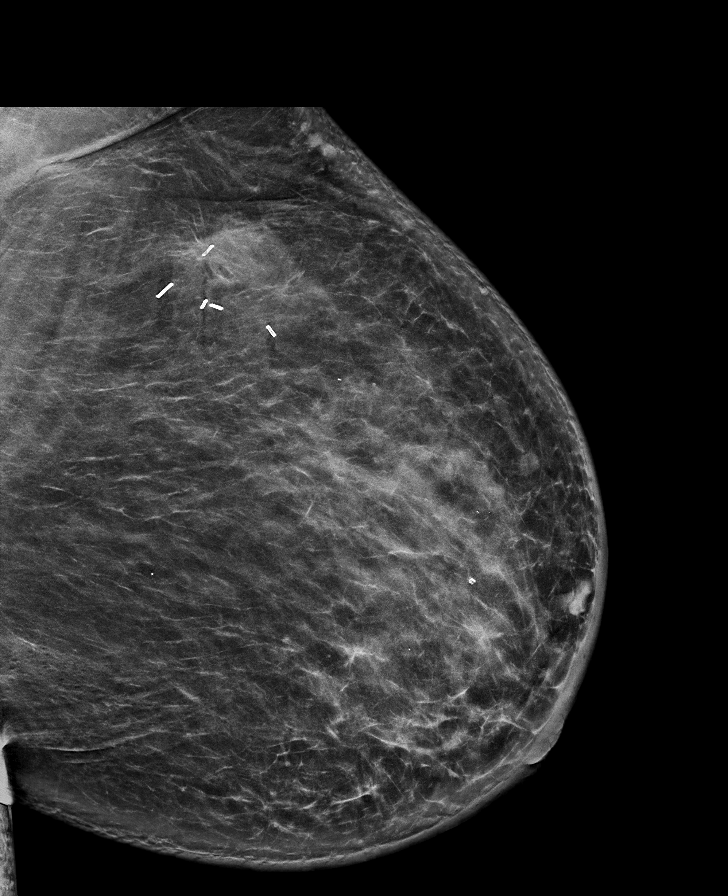

[R MLO tomo · tomo slice 45/88.0]
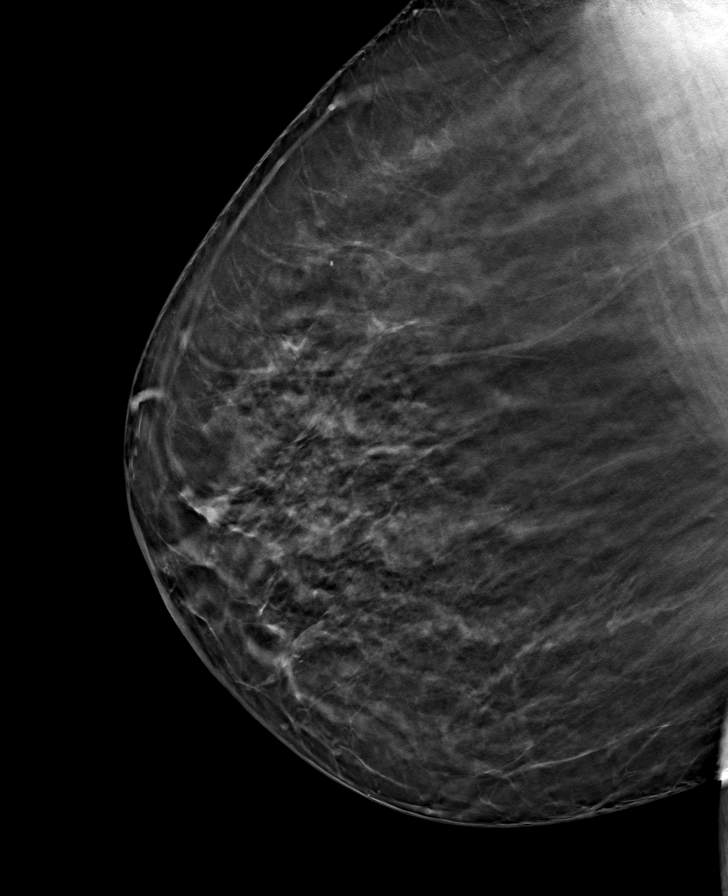

[8 of 37 positions shown; findings below may reference images not displayed]

ACR Breast Density Category b: There are scattered areas of
fibroglandular density.
FINDINGS: Post operative changes are seen in the leftbreast. No suspicious
mass, distortion, or microcalcifications are identified to suggest
presence of malignancy.

Mammographic images were processed with CAD.
IMPRESSION: No mammographic evidence for malignancy.

RECOMMENDATION:
Diagnostic mammogram is suggested in 1 year. (Code:[id number])

I have discussed the findings and recommendations with the patient.
Results were also provided in writing at the conclusion of the
visit. If applicable, a reminder letter will be sent to the patient
regarding the next appointment.

BI-RADS CATEGORY  2: Benign.

## 2018-07-07 ENCOUNTER — Telehealth: Payer: Self-pay | Admitting: Hematology and Oncology

## 2018-07-07 NOTE — Telephone Encounter (Signed)
I talk with patients son regarding video visit

## 2018-07-07 NOTE — Assessment & Plan Note (Signed)
Left lumpectomy 03/08/2016: IDC 2 cm, margins negative, 0/2 lymph nodes, grade 3, ER 0%, PR 0%, HER-2 negative ratio 1.31, Ki-67 40%, T1 CN 0 stage IA  Treatment plan: 1. Adjuvant chemotherapy with dose dense Adriamycin and Cytoxan 4 followed by weekly Taxol 7 started 04/17/2016- 07/03/16 2. Followed by radiation therapy in [place] completed: September 2018 ----------------------------------------------------------------------------------------------------------------------------------------- Breast cancer surveillance 1. Breast exam 07/11/2017: No palpable lumps or nodules of concern 2. Mammogram  02/13/2018: Benign, breast density category B  RTC in 1 year

## 2018-07-10 ENCOUNTER — Telehealth: Payer: Self-pay | Admitting: Hematology and Oncology

## 2018-07-10 NOTE — Telephone Encounter (Signed)
Left message for patient to verify mychart visit for pre reg °

## 2018-07-10 NOTE — Progress Notes (Signed)
HEMATOLOGY-ONCOLOGY [facility] VIDEO VISIT PROGRESS NOTE  I connected with Carla Pierce on 07/11/2018 at 11:00 AM EDT by MyChart video conference and verified that I am speaking with the correct person using two identifiers.  I discussed the limitations, risks, security and privacy concerns of performing an evaluation and management service by MyChart and the availability of in person appointments.  I also discussed with the patient that there may be a patient responsible charge related to this service. The patient expressed understanding and agreed to proceed.  Patient's Location: Home Physician Location: Clinic  CHIEF COMPLIANT: Follow-up of triple negative breast cancer on surveillance   INTERVAL HISTORY: Carla Pierce is a 48 y.o. female with above-mentioned history of triple negative breast cancer treated with lumpectomy followed by adjuvant chemotherapy and radiation. I last saw her a year ago. Mammogram on 02/13/18 sowed no evidence of malignancy bilaterally. She is over [place] today for annual follow-up.   Oncology History  Malignant neoplasm of upper-outer quadrant of left breast in female, estrogen receptor negative ([place])  03/20/1995 Initial Biopsy   Head and Neck Cancer   02/09/2016 Initial Diagnosis   Left breast biopsy 2:00: IDC, left axillary lymph node biopsy negative, grade 3, ER 0%, PR 0%, Ki-67 40%, HER-2 negative ratio 1.31, screening detected left breast mass 1.8 cm, left axillary lymph node 5 mm benign, T1 CN 0 stage IA clinical stage   03/13/2016 Surgery   Left lumpectomy: IDC 2 cm, margins negative, 0/2 lymph nodes, grade 3, ER 0%, PR 0%, HER-2 negative ratio 1.31, Ki-67 40%, T1 CN 0 stage IA   04/10/2016 - 07/03/2016 Chemotherapy   Adjuvant chemotherapy with dose dense Adriamycin and Cytoxan followed by Taxol weekly 7, stopped early due to neuropathy   08/29/2016 - 09/20/2016 Radiation Therapy   Adjuvant radiation: The left breast received 40.05 Gy in 15  treatments, and the left breast was boosted 10 Gy in 5 treatments. (received treatment in [place]).   10/10/2016 Genetic Testing   Genetic testing did not reveal a pathogenic mutation.  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 STK11, TP53, TSC1, TSC2, and VHL.  The following genes were evaluated for sequence changes only: SDHA and HOXB13 c.251G>A variant only.         REVIEW OF SYSTEMS:   Constitutional: Denies fevers, chills or abnormal weight loss Eyes: Denies blurriness of vision Ears, nose, mouth, throat, and face: Denies mucositis or sore throat Respiratory: Denies cough, dyspnea or wheezes Cardiovascular: Denies palpitation, chest discomfort Gastrointestinal:  Denies nausea, heartburn or change in bowel habits Skin: Denies abnormal skin rashes Lymphatics: Denies new lymphadenopathy or easy bruising Neurological:Denies numbness, tingling or new weaknesses Behavioral/Psych: Mood is stable, no new changes  Extremities: No lower extremity edema Breast: denies any pain or lumps or nodules in either breasts All other systems were reviewed with the patient and are negative.  Observations/Objective:  There were no vitals filed for this visit. There is no height or weight on file to calculate BMI.  I have reviewed the data as listed CMP Latest Ref Rng & Units 01/10/2017 07/10/2016 07/03/2016  Glucose 70 - 140 mg/dL 87 97 101  BUN  7 - 26 mg/dL 13 15.3 14.7  Creatinine 0.60 - 1.10 mg/dL 0.87 0.8 0.9  Sodium 136 - 145 mmol/L 142 141 144  Potassium 3.3 - 4.7 mmol/L 3.1(L) 3.6  3.6  Chloride 98 - 109 mmol/L 101 - -  CO2 22 - 29 mmol/L 33(H) 29 29  Calcium 8.4 - 10.4 mg/dL 9.5 9.1 9.3  Total Protein 6.4 - 8.3 g/dL 7.1 6.6 6.6  Total Bilirubin 0.2 - 1.2 mg/dL 0.4 0.33 0.28  Alkaline Phos 40 - 150 U/L 102 88 76  AST 5 - 34 U/L 38(H) 43(H) 40(H)  ALT 0 - 55 U/L 19 33 32    Lab Results  Component Value Date   WBC 5.5 01/10/2017   HGB 12.1 01/10/2017   HCT 37.4 01/10/2017   MCV 80.7 01/10/2017   PLT 281 01/10/2017   NEUTROABS 4.0 01/10/2017    Assessment Plan:  Malignant neoplasm of upper-outer quadrant of left breast in female, estrogen receptor negative ([place]) Left lumpectomy 03/08/2016: IDC 2 cm, margins negative, 0/2 lymph nodes, grade 3, ER 0%, PR 0%, HER-2 negative ratio 1.31, Ki-67 40%, T1 CN 0 stage IA  Treatment plan: 1. Adjuvant chemotherapy with dose dense Adriamycin and Cytoxan 4 followed by weekly Taxol 7 started 04/17/2016- 07/03/16 2. Followed by radiation therapy in [place] completed: September 2018 ----------------------------------------------------------------------------------------------------------------------------------------- Breast cancer surveillance 1. Breast exam 07/11/2017: No palpable lumps or nodules of concern 2. 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 Knee Arthritis: On Diclofenac RTC in 1 year  I discussed the assessment and treatment plan with the patient. The patient was provided an opportunity to ask questions and all were answered. The patient agreed with the plan and demonstrated an understanding of the instructions. The patient was advised to call back or seek an in-person evaluation if the symptoms worsen or if the condition fails to improve as anticipated.   I provided 15 minutes of face-to-face MyChart video visit time during this encounter.    Rulon Eisenmenger, MD  07/11/2018   I, Molly Dorshimer, am acting as scribe for [name], MD.  I have reviewed the above documentation for accuracy and completeness, and I agree with the above.

## 2018-07-11 ENCOUNTER — Inpatient Hospital Stay: Payer: BC Managed Care – PPO | Attending: Hematology and Oncology | Admitting: Hematology and Oncology

## 2018-07-11 DIAGNOSIS — Z923 Personal history of irradiation: Secondary | ICD-10-CM | POA: Diagnosis not present

## 2018-07-11 DIAGNOSIS — C50412 Malignant neoplasm of upper-outer quadrant of left female breast: Secondary | ICD-10-CM | POA: Diagnosis not present

## 2018-07-11 DIAGNOSIS — Z171 Estrogen receptor negative status [ER-]: Secondary | ICD-10-CM | POA: Diagnosis not present

## 2018-07-11 DIAGNOSIS — Z9221 Personal history of antineoplastic chemotherapy: Secondary | ICD-10-CM | POA: Diagnosis not present

## 2018-07-11 MED ORDER — GABAPENTIN 100 MG PO CAPS: 100.0000 mg | ORAL_CAPSULE | Freq: Every day | ORAL | 3 refills | Status: DC

## 2018-07-11 MED ORDER — BENZONATATE 100 MG PO CAPS: 100.0000 mg | ORAL_CAPSULE | Freq: Two times a day (BID) | ORAL | 0 refills | Status: DC | PRN

## 2018-12-23 DIAGNOSIS — U071 COVID-19: Secondary | ICD-10-CM

## 2018-12-23 HISTORY — DX: COVID-19: U07.1

## 2019-01-13 ENCOUNTER — Other Ambulatory Visit: Payer: Self-pay | Admitting: Hematology and Oncology

## 2019-01-13 DIAGNOSIS — Z9889 Other specified postprocedural states: Secondary | ICD-10-CM

## 2019-01-20 ENCOUNTER — Other Ambulatory Visit: Payer: Self-pay | Admitting: Hematology and Oncology

## 2019-01-23 ENCOUNTER — Other Ambulatory Visit: Payer: Self-pay | Admitting: Hematology and Oncology

## 2019-02-16 ENCOUNTER — Other Ambulatory Visit: Payer: Self-pay

## 2019-02-16 ENCOUNTER — Other Ambulatory Visit: Payer: Self-pay | Admitting: Hematology and Oncology

## 2019-02-16 ENCOUNTER — Ambulatory Visit
Admission: RE | Admit: 2019-02-16 | Discharge: 2019-02-16 | Disposition: A | Discharge status: Home / Self Care | Payer: BC Managed Care – PPO | Source: Ambulatory Visit | Attending: Hematology and Oncology | Admitting: Hematology and Oncology

## 2019-02-16 DIAGNOSIS — R921 Mammographic calcification found on diagnostic imaging of breast: Secondary | ICD-10-CM

## 2019-02-16 DIAGNOSIS — Z9889 Other specified postprocedural states: Secondary | ICD-10-CM

## 2019-02-16 HISTORY — DX: Personal history of antineoplastic chemotherapy: Z92.21

## 2019-02-16 HISTORY — DX: Personal history of irradiation: Z92.3

## 2019-02-23 ENCOUNTER — Ambulatory Visit
Admission: RE | Admit: 2019-02-23 | Discharge: 2019-02-23 | Disposition: A | Discharge status: Home / Self Care | Payer: BC Managed Care – PPO | Source: Ambulatory Visit | Attending: Hematology and Oncology | Admitting: Hematology and Oncology

## 2019-02-23 ENCOUNTER — Other Ambulatory Visit: Payer: Self-pay | Admitting: General Practice

## 2019-02-23 ENCOUNTER — Other Ambulatory Visit: Payer: Self-pay

## 2019-02-23 DIAGNOSIS — R921 Mammographic calcification found on diagnostic imaging of breast: Secondary | ICD-10-CM

## 2019-03-13 ENCOUNTER — Ambulatory Visit: Payer: Self-pay | Admitting: Surgery

## 2019-03-13 DIAGNOSIS — N6092 Unspecified benign mammary dysplasia of left breast: Secondary | ICD-10-CM

## 2019-03-13 NOTE — H&P (Signed)
Carla Pierce Documented: 03/13/2019 11:06 AM Location: [facility] Patient #: [id number] DOB: [birth year] Single / Language: Carla Pierce / Race: Black or African American Female  History of Present Illness Carla Pierce; 03/13/2019 1:16 PM) Patient words: 49 year old female with history of left breast cancer in 2018 status post radiation and chemotherapy. Patient has a strong family history of breast cancer.  She presents for follow-up. She underwent a recent diagnostic mammogram which showed calcifications posterior to the left nipple. Core biopsy showed atypical ductal hyperplasia. She has a history of left breast cancer which was triple negative treated with breast conserving surgery and radiation therapy as well as chemotherapy. She has no other complaints. The patient describes no nipple discharge, mass or pain  EXAM: DIGITAL DIAGNOSTIC BILATERAL MAMMOGRAM WITH CAD AND TOMO  COMPARISON: Previous exam(s).  ACR Breast Density Category b: There are scattered areas of fibroglandular density.  FINDINGS: Right breast: No suspicious mass, distortion, or microcalcifications are identified to suggest presence of malignancy.  Left breast: Spot magnification views were performed at the lumpectomy site in the upper outer left breast. There are stable postsurgical changes with no new suspicious finding. In the retroareolar left breast there are new grouped coarse heterogeneous calcifications spanning approximately 1.7 cm. No additional findings in the left breast.  Mammographic images were processed with CAD.  IMPRESSION: 1. Grouped coarse heterogeneous calcifications in the retroareolar left breast spanning 1.7 cm are indeterminate, possibly related to radiation therapy. Tissue sampling is recommended.  2. Stable postsurgical changes at the lumpectomy site left breast.  3. No mammographic evidence of malignancy in the right  breast.  RECOMMENDATION: Stereotactic core needle biopsy of the left breast retroareolar calcifications.  I have discussed the findings and recommendations with the patient. If applicable, a reminder letter will be sent to the patient regarding the next appointment.  BI-RADS CATEGORY 4: Suspicious.   Electronically Signed By: Carla Pierce M.D. On: 02/16/2019 14:27   Malignant neoplasm of upper-outer quadrant of left breast in female, estrogen receptor negative ([place]) Left lumpectomy 03/08/2016: IDC 2 cm, margins negative, 0/2 lymph nodes, grade 3, ER 0%, PR 0%, HER-2 negative ratio 1.31, Ki-67 40%, T1 CN 0 stage IA       Diagnosis Breast, left, needle core biopsy, retroareolar - FOCAL ATYPICAL DUCTAL HYPERPLASIA ARISING IN A BACKGROUND OF FIBROSIS AND CALCIFICATIONS Microscopic Comment These results were called to The Carla Pierce on February 24, 2019. Carla Pierce Pathologist, Electronic Signature (Case signed 02/24/2019) Specimen Gross and Clinical Information Specimen Comment TIF: 11:55 AM; extracted < 2 min; HX left breast CA S/P lump and radiation in 2018, calcifications Specimen(s) Obtained: Breast, left, needle core biopsy, retroareolar Specimen Clinical Information Coarse heterogenous calcs in the retroareolar breast, moderate suspicion Gross Received in formalin labeled with the patients name Carla Pierce) and "left breast calcs RA" is a 2.7 x 2.4 x 0.4 cm aggregate of yellow-tan fibrofatty tissue, submitted in toto in a single cassette. Time in formalin 1155, cold ischemic time less than two minutes. (LF:gt, 02/24/19) Report signed.  The patient is a 49 year old female.   Allergies (Carla Pierce, [place]; 03/13/2019 11:06 AM) No Known Drug Allergies [04/10/2016]: Allergies Reconciled  Medication History (Carla Pierce, [place]; 03/13/2019 11:07 AM) Levothyroxine Sodium (175MCG Tablet, Oral) Active. AmLODIPine Besylate  (10MG Tablet, Oral) Active. Losartan Potassium (100MG Tablet, Oral) Active. Albuterol Sulfate HFA (108 (90 Base)MCG/ACT Aerosol Soln, Inhalation) Active. Chlorthalidone (25MG Tablet, Oral) Active. Diclofenac Sodium (75MG Tablet DR,  Oral) Active. Ferrous Gluconate (324 (38 Fe)MG Tablet, Oral) Active. Fluconazole (150MG Tablet, Oral) Active. Gabapentin (100MG Capsule, Oral) Active. Potassium Chloride ER (10MEQ Tablet ER, Oral) Active. Vitamin D-3 (25 MCG(1000 UT) Capsule, Oral) Active. Medications Reconciled    Vitals (Carla A. Brown RMA; 03/13/2019 11:07 AM) 03/13/2019 11:07 AM Weight: 276.4 lb Height: 69in Body Surface Area: 2.37 m Body Mass Index: 40.82 kg/m  Temp.: 47F  Pulse: 95 (Regular)  BP: 128/86 (Sitting, Left Arm, Standard)        Physical Exam (Carla Pierce; 03/13/2019 1:17 PM)  General Mental Status-Alert. General Appearance-Consistent with stated age. Hydration-Well hydrated. Voice-Normal.  Breast Note: Left breast shows post radiation, surgical and biopsy changes without mass lesion. Right breast is normal.  Neurologic Neurologic evaluation reveals -alert and oriented x 3 with no impairment of recent or remote memory. Mental Status-Normal.  Lymphatic Head & Neck  General Head & Neck Lymphatics: Bilateral - Description - Normal. Axillary  General Axillary Region: Bilateral - Description - Normal. Tenderness - Non Tender.    Assessment & Plan (Carla Pierce; 03/13/2019 1:21 PM)  ATYPICAL DUCTAL HYPERPLASIA OF LEFT BREAST (N60.92) Impression: Recommend seed localized left breast lumpectomy. Risk of lumpectomy include bleeding, infection, seroma, more surgery, use of seed/wire, wound care, cosmetic deformity and the need for other treatments, death , blood clots, death. Pt agrees to proceed.  Total time 20 minutes discussing diagnosis, face-to-face time, examination, chart review, surgical  complications, potential other treatments  Current Plans Pt Education - [facility] Free Text Education/Instructions: discussed with patient and provided information. Pt Education - [facility] Breast Biopsy HCI: discussed with patient and provided information.  BREAST CANCER, LEFT (C50.912) Impression: Stable Discussed with medical oncology any role for tamoxifen as a risk reducing agent given her family history of breast cancer, personal history of breast cancer and recent diagnosis of atypical ductal hyperplasia. Discussed also risk reducing mastectomy well with her today and other options to reduce her risk given her age. She may also benefit from yearly magnetic resonance imaging

## 2019-03-13 NOTE — H&P (View-Only) (Signed)
Carla Pierce Documented: 03/13/2019 11:06 AM Location: [facility] Patient #: [id number] DOB: 11/15/1970 Single / Language: Carla Pierce / Race: Black or African American Female  History of Present Illness Carla Moores A. Ellery Meroney MD; 03/13/2019 1:16 PM) Patient words: 49 year old female with history of left breast cancer in 2018 status post radiation and chemotherapy. Patient has a strong family history of breast cancer.  She presents for follow-up. She underwent a recent diagnostic mammogram which showed calcifications posterior to Carla left nipple. Core biopsy showed atypical ductal hyperplasia. She has a history of left breast cancer which was triple negative treated with breast conserving surgery and radiation therapy as well as chemotherapy. She has no other complaints. Carla patient describes no nipple discharge, mass or pain  EXAM: DIGITAL DIAGNOSTIC BILATERAL MAMMOGRAM WITH CAD AND TOMO  COMPARISON: Previous exam(s).  ACR Breast Density Category b: There are scattered areas of fibroglandular density.  FINDINGS: Right breast: No suspicious mass, distortion, or microcalcifications are identified to suggest presence of malignancy.  Left breast: Spot magnification views were performed at Carla lumpectomy site in Carla upper outer left breast. There are stable postsurgical changes with no new suspicious finding. In Carla retroareolar left breast there are new grouped coarse heterogeneous calcifications spanning approximately 1.7 cm. No additional findings in Carla left breast.  Mammographic images were processed with CAD.  IMPRESSION: 1. Grouped coarse heterogeneous calcifications in Carla retroareolar left breast spanning 1.7 cm are indeterminate, possibly related to radiation therapy. Tissue sampling is recommended.  2. Stable postsurgical changes at Carla lumpectomy site left breast.  3. No mammographic evidence of malignancy in Carla right  breast.  RECOMMENDATION: Stereotactic core needle biopsy of Carla left breast retroareolar calcifications.  I have discussed Carla findings and recommendations with Carla patient. If applicable, a reminder letter will be sent to Carla patient regarding Carla next appointment.  BI-RADS CATEGORY 4: Suspicious.   Electronically Signed By: Carla Pierce M.D. On: 02/16/2019 14:27   Malignant neoplasm of upper-outer quadrant of left breast in female, estrogen receptor negative (Carla Pierce) Left lumpectomy 03/08/2016: IDC 2 cm, margins negative, 0/2 lymph nodes, grade 3, ER 0%, PR 0%, HER-2 negative ratio 1.31, Ki-67 40%, T1 CN 0 stage IA       Diagnosis Breast, left, needle core biopsy, retroareolar - FOCAL ATYPICAL DUCTAL HYPERPLASIA ARISING IN A BACKGROUND OF FIBROSIS AND CALCIFICATIONS Microscopic Comment These results were called to Carla Pierce on February 24, 2019. Carla Sheller MD Pathologist, Electronic Signature (Case signed 02/24/2019) Specimen Gross and Clinical Information Specimen Comment TIF: 11:55 AM; extracted < 2 min; HX left breast CA S/P lump and radiation in 2018, calcifications Specimen(s) Obtained: Breast, left, needle core biopsy, retroareolar Specimen Clinical Information Coarse heterogenous calcs in Carla retroareolar breast, moderate suspicion Gross Received in formalin labeled with Carla patients name Carla Pierce Carla Pierce) and "left breast calcs RA" is a 2.7 x 2.4 x 0.4 cm aggregate of yellow-tan fibrofatty tissue, submitted in toto in a single cassette. Time in formalin 1155, cold ischemic time less than two minutes. (LF:gt, 02/24/19) Report signed.  Carla patient is a 49 year old female.   Allergies (Carla Pierce, Carla Pierce; 03/13/2019 11:06 AM) No Known Drug Allergies [04/10/2016]: Allergies Reconciled  Medication History (Carla Pierce, [place]; 03/13/2019 11:07 AM) Levothyroxine Sodium (175MCG Tablet, Oral) Active. AmLODIPine Besylate  (10MG Tablet, Oral) Active. Losartan Potassium (100MG Tablet, Oral) Active. Albuterol Sulfate HFA (108 (90 Base)MCG/ACT Aerosol Soln, Inhalation) Active. Chlorthalidone (25MG Tablet, Oral) Active. Diclofenac Sodium (75MG Tablet DR,  Oral) Active. Ferrous Gluconate (324 (38 Fe)MG Tablet, Oral) Active. Fluconazole (150MG Tablet, Oral) Active. Gabapentin (100MG Capsule, Oral) Active. Potassium Chloride ER (10MEQ Tablet ER, Oral) Active. Vitamin D-3 (25 MCG(1000 UT) Capsule, Oral) Active. Medications Reconciled    Vitals (Carla Pierce RMA; 03/13/2019 11:07 AM) 03/13/2019 11:07 AM Weight: 276.4 lb Height: 69in Body Surface Area: 2.37 m Body Mass Index: 40.82 kg/m  Temp.: 61F  Pulse: 95 (Regular)  BP: 128/86 (Sitting, Left Arm, Standard)        Physical Exam (Carla Pierce A. Carla Bost MD; 03/13/2019 1:17 PM)  General Mental Status-Alert. General Appearance-Consistent with stated age. Hydration-Well hydrated. Voice-Normal.  Breast Note: Left breast shows post radiation, surgical and biopsy changes without mass lesion. Right breast is normal.  Neurologic Neurologic evaluation reveals -alert and oriented x 3 with no impairment of recent or remote memory. Mental Status-Normal.  Lymphatic Head & Neck  General Head & Neck Lymphatics: Bilateral - Description - Normal. Axillary  General Axillary Region: Bilateral - Description - Normal. Tenderness - Non Tender.    Assessment & Plan (Carla Pierce A. Carla Garrette MD; 03/13/2019 1:21 PM)  ATYPICAL DUCTAL HYPERPLASIA OF LEFT BREAST (N60.92) Impression: Recommend seed localized left breast lumpectomy. Risk of lumpectomy include bleeding, infection, seroma, more surgery, use of seed/wire, wound care, cosmetic deformity and Carla need for other treatments, death , blood clots, death. Pt agrees to proceed.  Total time 20 minutes discussing diagnosis, face-to-face time, examination, chart review, surgical  complications, potential other treatments  Current Plans Pt Education - [facility] Free Text Education/Instructions: discussed with patient and provided information. Pt Education - [facility] Breast Biopsy HCI: discussed with patient and provided information.  BREAST CANCER, LEFT (C50.912) Impression: Stable Discussed with medical oncology any role for tamoxifen as a risk reducing agent given her family history of breast cancer, personal history of breast cancer and recent diagnosis of atypical ductal hyperplasia. Discussed also risk reducing mastectomy well with her today and other options to reduce her risk given her age. She may also benefit from yearly magnetic resonance imaging

## 2019-03-17 ENCOUNTER — Other Ambulatory Visit: Payer: Self-pay | Admitting: Surgery

## 2019-03-17 DIAGNOSIS — N6092 Unspecified benign mammary dysplasia of left breast: Secondary | ICD-10-CM

## 2019-03-31 ENCOUNTER — Other Ambulatory Visit: Payer: Self-pay

## 2019-03-31 ENCOUNTER — Encounter (HOSPITAL_BASED_OUTPATIENT_CLINIC_OR_DEPARTMENT_OTHER): Payer: Self-pay | Admitting: Surgery

## 2019-04-06 ENCOUNTER — Other Ambulatory Visit (HOSPITAL_COMMUNITY)
Admission: RE | Admit: 2019-04-06 | Discharge: 2019-04-06 | Disposition: A | Discharge status: Home / Self Care | Payer: BC Managed Care – PPO | Source: Ambulatory Visit | Attending: Surgery | Admitting: Surgery

## 2019-04-06 ENCOUNTER — Other Ambulatory Visit: Payer: Self-pay

## 2019-04-06 DIAGNOSIS — Z01812 Encounter for preprocedural laboratory examination: Secondary | ICD-10-CM | POA: Insufficient documentation

## 2019-04-06 DIAGNOSIS — Z20822 Contact with and (suspected) exposure to covid-19: Secondary | ICD-10-CM | POA: Diagnosis not present

## 2019-04-06 LAB — SARS CORONAVIRUS 2 (TAT 6-24 HRS): SARS Coronavirus 2: NEGATIVE

## 2019-04-07 ENCOUNTER — Other Ambulatory Visit: Payer: Self-pay

## 2019-04-07 ENCOUNTER — Encounter (HOSPITAL_COMMUNITY)
Admission: RE | Admit: 2019-04-07 | Discharge: 2019-04-07 | Disposition: A | Discharge status: Home / Self Care | Payer: BC Managed Care – PPO | Source: Ambulatory Visit | Attending: Surgery | Admitting: Surgery

## 2019-04-07 DIAGNOSIS — Z01812 Encounter for preprocedural laboratory examination: Secondary | ICD-10-CM | POA: Diagnosis present

## 2019-04-07 DIAGNOSIS — Z01810 Encounter for preprocedural cardiovascular examination: Secondary | ICD-10-CM | POA: Diagnosis present

## 2019-04-07 DIAGNOSIS — R9431 Abnormal electrocardiogram [ECG] [EKG]: Secondary | ICD-10-CM | POA: Insufficient documentation

## 2019-04-07 DIAGNOSIS — I10 Essential (primary) hypertension: Secondary | ICD-10-CM | POA: Diagnosis present

## 2019-04-07 DIAGNOSIS — N6092 Unspecified benign mammary dysplasia of left breast: Secondary | ICD-10-CM

## 2019-04-07 LAB — CBC WITH DIFFERENTIAL/PLATELET
Abs Immature Granulocytes: 0.02 10*3/uL (ref 0.00–0.07)
Basophils Absolute: 0 10*3/uL (ref 0.0–0.1)
Basophils Relative: 0 %
Eosinophils Absolute: 0.1 10*3/uL (ref 0.0–0.5)
Eosinophils Relative: 2 %
HCT: 40.6 % (ref 36.0–46.0)
Hemoglobin: 13.1 g/dL (ref 12.0–15.0)
Immature Granulocytes: 0 %
Lymphocytes Relative: 19 %
Lymphs Abs: 1.1 10*3/uL (ref 0.7–4.0)
MCH: 28.2 pg (ref 26.0–34.0)
MCHC: 32.3 g/dL (ref 30.0–36.0)
MCV: 87.5 fL (ref 80.0–100.0)
Monocytes Absolute: 0.3 10*3/uL (ref 0.1–1.0)
Monocytes Relative: 6 %
Neutro Abs: 4.2 10*3/uL (ref 1.7–7.7)
Neutrophils Relative %: 73 %
Platelets: 280 10*3/uL (ref 150–400)
RBC: 4.64 MIL/uL (ref 3.87–5.11)
RDW: 15.3 % (ref 11.5–15.5)
WBC: 5.8 10*3/uL (ref 4.0–10.5)
nRBC: 0 % (ref 0.0–0.2)

## 2019-04-07 LAB — COMPREHENSIVE METABOLIC PANEL
ALT: 21 U/L (ref 0–44)
AST: 52 U/L — ABNORMAL HIGH (ref 15–41)
Albumin: 3.9 g/dL (ref 3.5–5.0)
Alkaline Phosphatase: 79 U/L (ref 38–126)
Anion gap: 10 (ref 5–15)
BUN: 18 mg/dL (ref 6–20)
CO2: 28 mmol/L (ref 22–32)
Calcium: 8.9 mg/dL (ref 8.9–10.3)
Chloride: 99 mmol/L (ref 98–111)
Creatinine, Ser: 0.85 mg/dL (ref 0.44–1.00)
GFR calc Af Amer: >60 mL/min (ref >60)
GFR calc non Af Amer: >60 mL/min (ref >60)
Glucose, Bld: 94 mg/dL (ref 70–99)
Potassium: 2.8 mmol/L — ABNORMAL LOW (ref 3.5–5.1)
Sodium: 137 mmol/L (ref 135–145)
Total Bilirubin: 0.5 mg/dL (ref 0.3–1.2)
Total Protein: 7 g/dL (ref 6.5–8.1)

## 2019-04-07 NOTE — Progress Notes (Signed)
Dr. Doroteo Glassman notified of patient's potassium level of 2.8.   Patient states that she is taking 10 meq of potassium per day.  Per Dr. Doroteo Glassman, instructed patient to take potassium 10 meq tonight, in the morning and tomorrow night.  Patient verbalized understanding.  Will recheck potassium level day of surgery.

## 2019-04-08 ENCOUNTER — Ambulatory Visit
Admission: RE | Admit: 2019-04-08 | Discharge: 2019-04-08 | Disposition: A | Discharge status: Home / Self Care | Payer: BC Managed Care – PPO | Source: Ambulatory Visit | Attending: Surgery | Admitting: Surgery

## 2019-04-08 DIAGNOSIS — N6092 Unspecified benign mammary dysplasia of left breast: Secondary | ICD-10-CM

## 2019-04-08 NOTE — Progress Notes (Signed)

## 2019-04-09 ENCOUNTER — Ambulatory Visit (HOSPITAL_BASED_OUTPATIENT_CLINIC_OR_DEPARTMENT_OTHER)
Admission: RE | Admit: 2019-04-09 | Discharge: 2019-04-09 | Disposition: A | Discharge status: Home / Self Care | Payer: BC Managed Care – PPO | Attending: Surgery | Admitting: Surgery

## 2019-04-09 ENCOUNTER — Encounter (HOSPITAL_BASED_OUTPATIENT_CLINIC_OR_DEPARTMENT_OTHER)
Admission: RE | Disposition: A | Discharge status: Home / Self Care | Payer: Self-pay | Source: Home / Self Care | Attending: Surgery

## 2019-04-09 ENCOUNTER — Other Ambulatory Visit: Payer: Self-pay

## 2019-04-09 ENCOUNTER — Ambulatory Visit (HOSPITAL_BASED_OUTPATIENT_CLINIC_OR_DEPARTMENT_OTHER): Payer: BC Managed Care – PPO | Admitting: Anesthesiology

## 2019-04-09 ENCOUNTER — Ambulatory Visit
Admission: RE | Admit: 2019-04-09 | Discharge: 2019-04-09 | Disposition: A | Discharge status: Home / Self Care | Payer: BC Managed Care – PPO | Source: Ambulatory Visit | Attending: Surgery | Admitting: Surgery

## 2019-04-09 ENCOUNTER — Encounter (HOSPITAL_BASED_OUTPATIENT_CLINIC_OR_DEPARTMENT_OTHER): Payer: Self-pay | Admitting: Surgery

## 2019-04-09 DIAGNOSIS — Z803 Family history of malignant neoplasm of breast: Secondary | ICD-10-CM | POA: Diagnosis not present

## 2019-04-09 DIAGNOSIS — I10 Essential (primary) hypertension: Secondary | ICD-10-CM | POA: Insufficient documentation

## 2019-04-09 DIAGNOSIS — Z8616 Personal history of COVID-19: Secondary | ICD-10-CM | POA: Diagnosis not present

## 2019-04-09 DIAGNOSIS — Z6841 Body Mass Index (BMI) 40.0 and over, adult: Secondary | ICD-10-CM | POA: Diagnosis not present

## 2019-04-09 DIAGNOSIS — N6092 Unspecified benign mammary dysplasia of left breast: Secondary | ICD-10-CM

## 2019-04-09 DIAGNOSIS — Z9221 Personal history of antineoplastic chemotherapy: Secondary | ICD-10-CM | POA: Diagnosis not present

## 2019-04-09 DIAGNOSIS — Z79890 Hormone replacement therapy (postmenopausal): Secondary | ICD-10-CM | POA: Insufficient documentation

## 2019-04-09 DIAGNOSIS — E039 Hypothyroidism, unspecified: Secondary | ICD-10-CM | POA: Diagnosis not present

## 2019-04-09 DIAGNOSIS — Z79899 Other long term (current) drug therapy: Secondary | ICD-10-CM | POA: Insufficient documentation

## 2019-04-09 DIAGNOSIS — Z923 Personal history of irradiation: Secondary | ICD-10-CM | POA: Insufficient documentation

## 2019-04-09 DIAGNOSIS — Z853 Personal history of malignant neoplasm of breast: Secondary | ICD-10-CM | POA: Diagnosis not present

## 2019-04-09 HISTORY — PX: BREAST LUMPECTOMY WITH RADIOACTIVE SEED LOCALIZATION: SHX6424

## 2019-04-09 LAB — BASIC METABOLIC PANEL
Anion gap: 11 (ref 5–15)
BUN: 12 mg/dL (ref 6–20)
CO2: 29 mmol/L (ref 22–32)
Calcium: 9.6 mg/dL (ref 8.9–10.3)
Chloride: 99 mmol/L (ref 98–111)
Creatinine, Ser: 0.95 mg/dL (ref 0.44–1.00)
GFR calc Af Amer: >60 mL/min (ref >60)
GFR calc non Af Amer: >60 mL/min (ref >60)
Glucose, Bld: 91 mg/dL (ref 70–99)
Potassium: 4.2 mmol/L (ref 3.5–5.1)
Sodium: 139 mmol/L (ref 135–145)

## 2019-04-09 SURGERY — BREAST LUMPECTOMY WITH RADIOACTIVE SEED LOCALIZATION
Anesthesia: General | Site: Breast | Laterality: Left

## 2019-04-09 MED ORDER — EPHEDRINE SULFATE 50 MG/ML IJ SOLN
INTRAMUSCULAR | Status: DC | PRN
  Administered 2019-04-09: 10 mg via INTRAVENOUS
  Administered 2019-04-09: 15 mg via INTRAVENOUS
  Administered 2019-04-09 (×2): 10 mg via INTRAVENOUS

## 2019-04-09 MED ORDER — LIDOCAINE HCL (CARDIAC) PF 100 MG/5ML IV SOSY: PREFILLED_SYRINGE | INTRAVENOUS | Status: DC | PRN

## 2019-04-09 MED ORDER — PROPOFOL 10 MG/ML IV BOLUS
INTRAVENOUS | Status: DC | PRN
  Administered 2019-04-09: 150 mg via INTRAVENOUS
  Administered 2019-04-09: 50 mg via INTRAVENOUS

## 2019-04-09 MED ORDER — HYDROMORPHONE HCL 1 MG/ML IJ SOLN
INTRAMUSCULAR | Status: AC
  Filled 2019-04-09: qty 0.5

## 2019-04-09 MED ORDER — KETOROLAC TROMETHAMINE 30 MG/ML IJ SOLN
INTRAMUSCULAR | Status: AC
  Filled 2019-04-09: qty 1

## 2019-04-09 MED ORDER — CHLORHEXIDINE GLUCONATE CLOTH 2 % EX PADS: 6.0000 | MEDICATED_PAD | Freq: Once | CUTANEOUS | Status: DC

## 2019-04-09 MED ORDER — OXYCODONE HCL 5 MG PO TABS: 5.0000 mg | ORAL_TABLET | Freq: Once | ORAL | Status: DC | PRN

## 2019-04-09 MED ORDER — HYDROCODONE-ACETAMINOPHEN 5-325 MG PO TABS: 1.0000 | ORAL_TABLET | Freq: Four times a day (QID) | ORAL | 0 refills | Status: DC | PRN

## 2019-04-09 MED ORDER — DEXAMETHASONE SODIUM PHOSPHATE 4 MG/ML IJ SOLN
INTRAMUSCULAR | Status: DC | PRN
  Administered 2019-04-09: 10 mg via INTRAVENOUS

## 2019-04-09 MED ORDER — ACETAMINOPHEN 500 MG PO TABS
ORAL_TABLET | ORAL | Status: AC
  Filled 2019-04-09: qty 2

## 2019-04-09 MED ORDER — PHENYLEPHRINE HCL (PRESSORS) 10 MG/ML IV SOLN
INTRAVENOUS | Status: DC | PRN
  Administered 2019-04-09: 40 ug via INTRAVENOUS
  Administered 2019-04-09 (×3): 80 ug via INTRAVENOUS

## 2019-04-09 MED ORDER — LIDOCAINE 2% (20 MG/ML) 5 ML SYRINGE
INTRAMUSCULAR | Status: DC | PRN
  Administered 2019-04-09: 100 mg via INTRAVENOUS

## 2019-04-09 MED ORDER — IBUPROFEN 800 MG PO TABS: 800.0000 mg | ORAL_TABLET | Freq: Three times a day (TID) | ORAL | 0 refills | Status: DC | PRN

## 2019-04-09 MED ORDER — OXYCODONE HCL 5 MG/5ML PO SOLN: 5.0000 mg | Freq: Once | ORAL | Status: DC | PRN

## 2019-04-09 MED ORDER — DEXTROSE 5 % IV SOLN
3.0000 g | INTRAVENOUS | Status: AC
  Administered 2019-04-09: 3 g via INTRAVENOUS

## 2019-04-09 MED ORDER — CEFAZOLIN SODIUM-DEXTROSE 1-4 GM/50ML-% IV SOLN
INTRAVENOUS | Status: AC
  Filled 2019-04-09: qty 50

## 2019-04-09 MED ORDER — GABAPENTIN 300 MG PO CAPS
300.0000 mg | ORAL_CAPSULE | ORAL | Status: AC
  Administered 2019-04-09: 300 mg via ORAL

## 2019-04-09 MED ORDER — HYDROMORPHONE HCL 1 MG/ML IJ SOLN
0.2500 mg | INTRAMUSCULAR | Status: DC | PRN
  Administered 2019-04-09 (×2): 0.5 mg via INTRAVENOUS

## 2019-04-09 MED ORDER — BUPIVACAINE HCL (PF) 0.25 % IJ SOLN
INTRAMUSCULAR | Status: DC | PRN
  Administered 2019-04-09: 10 mL

## 2019-04-09 MED ORDER — ONDANSETRON HCL 4 MG/2ML IJ SOLN
INTRAMUSCULAR | Status: DC | PRN
  Administered 2019-04-09: 4 mg via INTRAVENOUS

## 2019-04-09 MED ORDER — KETOROLAC TROMETHAMINE 30 MG/ML IJ SOLN
30.0000 mg | Freq: Once | INTRAMUSCULAR | Status: AC | PRN
  Administered 2019-04-09: 30 mg via INTRAVENOUS

## 2019-04-09 MED ORDER — ACETAMINOPHEN 500 MG PO TABS
1000.0000 mg | ORAL_TABLET | ORAL | Status: AC
  Administered 2019-04-09: 1000 mg via ORAL

## 2019-04-09 MED ORDER — LACTATED RINGERS IV SOLN: INTRAVENOUS | Status: DC

## 2019-04-09 MED ORDER — MIDAZOLAM HCL 2 MG/2ML IJ SOLN
INTRAMUSCULAR | Status: AC
  Filled 2019-04-09: qty 2

## 2019-04-09 MED ORDER — PROMETHAZINE HCL 25 MG/ML IJ SOLN: 6.2500 mg | INTRAMUSCULAR | Status: DC | PRN

## 2019-04-09 MED ORDER — FENTANYL CITRATE (PF) 100 MCG/2ML IJ SOLN
INTRAMUSCULAR | Status: AC
  Filled 2019-04-09: qty 2

## 2019-04-09 MED ORDER — MIDAZOLAM HCL 5 MG/5ML IJ SOLN
INTRAMUSCULAR | Status: DC | PRN
  Administered 2019-04-09: 2 mg via INTRAVENOUS

## 2019-04-09 MED ORDER — FENTANYL CITRATE (PF) 100 MCG/2ML IJ SOLN
INTRAMUSCULAR | Status: DC | PRN
  Administered 2019-04-09 (×2): 50 ug via INTRAVENOUS

## 2019-04-09 MED ORDER — GABAPENTIN 300 MG PO CAPS
ORAL_CAPSULE | ORAL | Status: AC
  Filled 2019-04-09: qty 1

## 2019-04-09 MED ORDER — PROPOFOL 10 MG/ML IV BOLUS
INTRAVENOUS | Status: AC
  Filled 2019-04-09: qty 20

## 2019-04-09 MED ORDER — SUCCINYLCHOLINE CHLORIDE 20 MG/ML IJ SOLN
INTRAMUSCULAR | Status: DC | PRN
  Administered 2019-04-09: 140 mg via INTRAVENOUS

## 2019-04-09 MED ORDER — MEPERIDINE HCL 25 MG/ML IJ SOLN: 6.2500 mg | INTRAMUSCULAR | Status: DC | PRN

## 2019-04-09 SURGICAL SUPPLY — 35 items
BINDER BREAST 3XL (GAUZE/BANDAGES/DRESSINGS) ×3 IMPLANT
BINDER BREAST XXLRG (GAUZE/BANDAGES/DRESSINGS) IMPLANT
BLADE SURG 15 STRL LF DISP TIS (BLADE) ×1 IMPLANT
BLADE SURG 15 STRL SS (BLADE) ×2
CHLORAPREP W/TINT 26 (MISCELLANEOUS) ×3 IMPLANT
COVER BACK TABLE 60X90IN (DRAPES) ×3 IMPLANT
COVER MAYO STAND STRL (DRAPES) ×3 IMPLANT
COVER PROBE W GEL 5X96 (DRAPES) ×3 IMPLANT
DERMABOND ADVANCED (GAUZE/BANDAGES/DRESSINGS) ×2
DERMABOND ADVANCED .7 DNX12 (GAUZE/BANDAGES/DRESSINGS) ×1 IMPLANT
DRAPE LAPAROTOMY 100X72 PEDS (DRAPES) ×3 IMPLANT
DRAPE UTILITY XL STRL (DRAPES) ×3 IMPLANT
ELECT COATED BLADE 2.86 ST (ELECTRODE) ×3 IMPLANT
ELECT REM PT RETURN 9FT ADLT (ELECTROSURGICAL) ×3
ELECTRODE REM PT RTRN 9FT ADLT (ELECTROSURGICAL) ×1 IMPLANT
GLOVE BIO SURGEON STRL SZ 6.5 (GLOVE) ×2 IMPLANT
GLOVE BIO SURGEONS STRL SZ 6.5 (GLOVE) ×1
GLOVE BIOGEL PI IND STRL 8 (GLOVE) ×1 IMPLANT
GLOVE BIOGEL PI INDICATOR 8 (GLOVE) ×2
GLOVE ECLIPSE 8.0 STRL XLNG CF (GLOVE) ×3 IMPLANT
GOWN STRL REUS W/ TWL LRG LVL3 (GOWN DISPOSABLE) ×2 IMPLANT
GOWN STRL REUS W/TWL LRG LVL3 (GOWN DISPOSABLE) ×4
HEMOSTAT SNOW SURGICEL 2X4 (HEMOSTASIS) IMPLANT
KIT MARKER MARGIN INK (KITS) ×3 IMPLANT
NEEDLE HYPO 25X1 1.5 SAFETY (NEEDLE) ×3 IMPLANT
NS IRRIG 1000ML POUR BTL (IV SOLUTION) ×3 IMPLANT
PACK BASIN DAY SURGERY FS (CUSTOM PROCEDURE TRAY) ×3 IMPLANT
PENCIL SMOKE EVACUATOR (MISCELLANEOUS) ×3 IMPLANT
SLEEVE SCD COMPRESS KNEE MED (MISCELLANEOUS) ×3 IMPLANT
SPONGE LAP 4X18 RFD (DISPOSABLE) ×3 IMPLANT
SUT MNCRL AB 4-0 PS2 18 (SUTURE) ×3 IMPLANT
SUT VICRYL 3-0 CR8 SH (SUTURE) ×3 IMPLANT
SYR CONTROL 10ML LL (SYRINGE) ×3 IMPLANT
TOWEL GREEN STERILE FF (TOWEL DISPOSABLE) ×3 IMPLANT
TRAY FAXITRON CT DISP (TRAY / TRAY PROCEDURE) ×3 IMPLANT

## 2019-04-09 NOTE — Anesthesia Preprocedure Evaluation (Addendum)
Anesthesia Evaluation  Patient identified by MRN, date of birth, ID band Patient awake    Reviewed: Allergy & Precautions, NPO status , Patient's Chart, lab work & pertinent test results  History of Anesthesia Complications Negative for: history of anesthetic complications  Airway Mallampati: II  TM Distance: >3 FB Neck ROM: Limited    Dental  (+) Teeth Intact   Pulmonary neg pulmonary ROS,    Pulmonary exam normal        Cardiovascular hypertension, Normal cardiovascular exam     Neuro/Psych negative neurological ROS  negative psych ROS   GI/Hepatic Neg liver ROS, GERD  ,  Endo/Other  Hypothyroidism Morbid obesity  Renal/GU negative Renal ROS  negative genitourinary   Musculoskeletal negative musculoskeletal ROS (+)   Abdominal   Peds  Hematology negative hematology ROS (+)   Anesthesia Other Findings  H/o head & neck cancer s/p chemo/XRT Recent COVID infection (12/23/18)  Reproductive/Obstetrics                            Anesthesia Physical Anesthesia Plan  ASA: III  Anesthesia Plan: General   Post-op Pain Management:    Induction: Intravenous and Rapid sequence  PONV Risk Score and Plan: 3 and Ondansetron, Dexamethasone, Treatment may vary due to age or medical condition and Midazolam  Airway Management Planned: Oral ETT and Video Laryngoscope Planned  Additional Equipment: None  Intra-op Plan:   Post-operative Plan: Extubation in OR  Informed Consent: I have reviewed the patients History and Physical, chart, labs and discussed the procedure including the risks, benefits and alternatives for the proposed anesthesia with the patient or authorized representative who has indicated his/her understanding and acceptance.     Dental advisory given  Plan Discussed with:   Anesthesia Plan Comments:         Anesthesia Quick Evaluation

## 2019-04-09 NOTE — Op Note (Signed)
Preoperative diagnosis: Left breast atypical ductal hyperplasia with microcalcifications  Postoperative diagnosis: Same  Procedure: Left breast seed localized lumpectomy  Surgeon: [name], MD  Anesthesia: General with 0.25% Marcaine local with epinephrine  EBL: Minimal  Specimen: Left breast tissue with seed and clip verified by Faxitron.  Of note the seed was separate from the clip and was sent separately.  Drains: None  IV fluids: Per anesthesia record  Indications for procedure: The patient is a 49 year old female with a past history of breast cancer who on recent mammogram was noted to have an area in her left breast in the subareolar position that was atypical in appearance.  Core biopsy which showed atypical ductal hyperplasia.  Excision recommended due to her past history of cancer and to exclude further malignancy.The procedure has been discussed with the patient. Alternatives to surgery have been discussed with the patient.  Risks of surgery include bleeding,  Infection,  Seroma formation, death,  and the need for further surgery.   The patient understands and wishes to proceed.   Description of procedure: The patient was met in the holding area.  Neoprobe used to verify seed in her left breast.  Films available for review.  All questions were answered.  She is taken back to the operative room.  She is placed supine upon the OR table.  After induction of general esthesia left breast was prepped and draped in a sterile fashion.  Timeout was performed.  Proper patient, site and procedure were verified.  Neoprobe was used to identify the seed in a subareolar position.  Local anesthetic infiltrated along the inferior border of the nipple areolar complex.  Curvilinear incision made along the inferior border of the nipple areolar complex.  Dissection can was carried around the seed and clip.  Gross margins negative.  Of note the seed was dislodged and removed separately and placed in  the same container.  The tissue was excised and Faxitron revealed the clip to be in the specimen.  Background counts approached 0.  Cavity was then irrigated.  Local anesthetic infiltrated and hemostasis achieved.  Cavity closed with 3-0 Vicryl for Monocryl.  Dermabond applied.  All counts were found to be correct.  The patient was awoke extubated taken to recovery in satisfactory condition.

## 2019-04-09 NOTE — Discharge Instructions (Signed)
[facility] Office Phone Number [phone]  BREAST BIOPSY/ PARTIAL MASTECTOMY: POST OP INSTRUCTIONS  Always review your discharge instruction sheet given to you by the facility where your surgery was performed.  IF YOU HAVE DISABILITY OR FAMILY LEAVE FORMS, YOU MUST BRING THEM TO THE OFFICE FOR PROCESSING.  DO NOT GIVE THEM TO YOUR DOCTOR.  1. A prescription for pain medication may be given to you upon discharge.  Take your pain medication as prescribed, if needed.  If narcotic pain medicine is not needed, then you may take acetaminophen (Tylenol) or ibuprofen (Advil) as needed. 2. Take your usually prescribed medications unless otherwise directed 3. If you need a refill on your pain medication, please contact your pharmacy.  They will contact our office to request authorization.  Prescriptions will not be filled after 5pm or on week-ends. 4. You should eat very light the first 24 hours after surgery, such as soup, crackers, pudding, etc.  Resume your normal diet the day after surgery. 5. Most patients will experience some swelling and bruising in the breast.  Ice packs and a good support bra will help.  Swelling and bruising can take several days to resolve.  6. It is common to experience some constipation if taking pain medication after surgery.  Increasing fluid intake and taking a stool softener will usually help or prevent this problem from occurring.  A mild laxative (Milk of Magnesia or Miralax) should be taken according to package directions if there are no bowel movements after 48 hours. 7. Unless discharge instructions indicate otherwise, you may remove your bandages 24-48 hours after surgery, and you may shower at that time.  You may have steri-strips (small skin tapes) in place directly over the incision.  These strips should be left on the skin for 7-10 days.  If your surgeon used skin glue on the incision, you may shower in 24 hours.  The glue will flake off over the  next 2-3 weeks.  Any sutures or staples will be removed at the office during your follow-up visit. 8. ACTIVITIES:  You may resume regular daily activities (gradually increasing) beginning the next day.  Wearing a good support bra or sports bra minimizes pain and swelling.  You may have sexual intercourse when it is comfortable. a. You may drive when you no longer are taking prescription pain medication, you can comfortably wear a seatbelt, and you can safely maneuver your car and apply brakes. b. RETURN TO WORK:  ______________________________________________________________________________________ 9. You should see your doctor in the office for a follow-up appointment approximately two weeks after your surgery.  Your doctor's nurse will typically make your follow-up appointment when she calls you with your pathology report.  Expect your pathology report 2-3 business days after your surgery.  You may call to check if you do not hear from [place] after three days. 10. OTHER INSTRUCTIONS: _______________________________________________________________________________________________ _____________________________________________________________________________________________________________________________________ _____________________________________________________________________________________________________________________________________ _____________________________________________________________________________________________________________________________________  WHEN TO CALL YOUR DOCTOR: 1. Fever over 101.0 2. Nausea and/or vomiting. 3. Extreme swelling or bruising. 4. Continued bleeding from incision. 5. Increased pain, redness, or drainage from the incision.  The clinic staff is available to answer your questions during regular business hours.  Please don't hesitate to call and ask to speak to one of the nurses for clinical concerns.  If you have a medical emergency, go to the nearest  emergency room or call 911.  A surgeon from [facility] is always on call at the hospital.  For further questions, please visit [email or web address]  Post Anesthesia Home Care Instructions  Activity: Get plenty of rest for the remainder of the day. A responsible individual must stay with you for 24 hours following the procedure.  For the next 24 hours, DO NOT: -Drive a car -Paediatric nurse -Drink alcoholic beverages -Take any medication unless instructed by your physician -Make any legal decisions or sign important papers.  Meals: Start with liquid foods such as gelatin or soup. Progress to regular foods as tolerated. Avoid greasy, spicy, heavy foods. If nausea and/or vomiting occur, drink only clear liquids until the nausea and/or vomiting subsides. Call your physician if vomiting continues.  Special Instructions/Symptoms: Your throat may feel dry or sore from the anesthesia or the breathing tube placed in your throat during surgery. If this causes discomfort, gargle with warm salt water. The discomfort should disappear within 24 hours.  *May have Tylenol at 5:30pm *May have Ibuprofen at 7:45pm

## 2019-04-09 NOTE — Anesthesia Procedure Notes (Signed)
Procedure Name: Intubation Date/Time: 04/09/2019 12:34 PM Performed by: Eulas Post, Atiya Yera W, CRNA Pre-anesthesia Checklist: Patient identified, Emergency Drugs available, Suction available and Patient being monitored Patient Re-evaluated:Patient Re-evaluated prior to induction Oxygen Delivery Method: Circle system utilized Preoxygenation: Pre-oxygenation with 100% oxygen Induction Type: IV induction Ventilation: Mask ventilation without difficulty Laryngoscope Size: Glidescope Grade View: Grade II Tube type: Oral Number of attempts: 2 Airway Equipment and Method: Stylet Placement Confirmation: ETT inserted through vocal cords under direct vision,  positive ETCO2 and breath sounds checked- equal and bilateral Secured at: 21 cm Tube secured with: Tape Dental Injury: Teeth and Oropharynx as per pre-operative assessment  Comments: Two attempts made to seat LMA. Intubated with glide, good view.

## 2019-04-09 NOTE — Interval H&P Note (Signed)
History and Physical Interval Note:  04/09/2019 11:53 AM  Carla Pierce  has presented today for surgery, with the diagnosis of ATYPICAL DUCTAL HYPERPLASIA.  The various methods of treatment have been discussed with the patient and family. After consideration of risks, benefits and other options for treatment, the patient has consented to  Procedure(s): LEFTBREAST LUMPECTOMY WITH RADIOACTIVE SEED LOCALIZATION (Left) as a surgical intervention.  The patient's history has been reviewed, patient examined, no change in status, stable for surgery.  I have reviewed the patient's chart and labs.  Questions were answered to the patient's satisfaction.     Rosalia

## 2019-04-09 NOTE — Transfer of Care (Signed)
Immediate Anesthesia Transfer of Care Note  Patient: Carla Pierce  Procedure(s) Performed: LEFT BREAST LUMPECTOMY WITH RADIOACTIVE SEED LOCALIZATION (Left Breast)  Patient Location: PACU  Anesthesia Type:General  Level of Consciousness: awake  Airway & Oxygen Therapy: Patient Spontanous Breathing and Patient connected to face mask oxygen  Post-op Assessment: Report given to RN and Post -op Vital signs reviewed and stable  Post vital signs: Reviewed and stable  Last Vitals:  Vitals Value Taken Time  BP    Temp    Pulse 88 04/09/19 1323  Resp 16 04/09/19 1323  SpO2 100 % 04/09/19 1323  Vitals shown include unvalidated device data.  Last Pain:  Vitals:   04/09/19 1118  TempSrc: Temporal  PainSc: 0-No pain      Patients Stated Pain Goal: 1 ([id number])  Complications: No apparent anesthesia complications

## 2019-04-10 LAB — SURGICAL PATHOLOGY

## 2019-04-20 NOTE — Anesthesia Postprocedure Evaluation (Signed)
Anesthesia Post Note  Patient: Carla Pierce  Procedure(s) Performed: LEFT BREAST LUMPECTOMY WITH RADIOACTIVE SEED LOCALIZATION (Left Breast)     Patient location during evaluation: PACU Anesthesia Type: General Level of consciousness: awake and alert Pain management: pain level controlled Vital Signs Assessment: post-procedure vital signs reviewed and stable Respiratory status: spontaneous breathing, nonlabored ventilation and respiratory function stable Cardiovascular status: blood pressure returned to baseline and stable Postop Assessment: no apparent nausea or vomiting Anesthetic complications: no    Last Vitals:  Vitals:   04/09/19 1415 04/09/19 1420  BP:  126/65  Pulse: 83 82  Resp: 14 18  Temp:  36.7 C  SpO2: 99% 95%    Last Pain:  Vitals:   04/10/19 0904  TempSrc:   PainSc: 0-No pain   Pain Goal: Patients Stated Pain Goal: 1 (04/09/19 1118)                 Lidia Collum

## 2019-04-26 ENCOUNTER — Other Ambulatory Visit: Payer: Self-pay | Admitting: Hematology and Oncology

## 2019-04-27 NOTE — Telephone Encounter (Signed)
Controlled substance in [place] if you could please send, thank you.

## 2019-07-14 ENCOUNTER — Ambulatory Visit: Payer: BC Managed Care – PPO | Admitting: Hematology and Oncology

## 2019-07-22 NOTE — Progress Notes (Signed)
Patient Care Team: [name], DO as PCP - General (Family Medicine) [name], MD as Consulting Physician (General Surgery) [name], MD as Consulting Physician (Hematology and Oncology) [name], MD as Consulting Physician (Radiation Oncology) Gardenia Phlegm, NP as Nurse Practitioner (Hematology and Oncology) Jamey Reas, MD as Referring Physician (Specialist)  DIAGNOSIS:    ICD-10-CM   1. Malignant neoplasm of upper-outer quadrant of left breast in female, estrogen receptor negative ([place])  C50.412    Z17.1     SUMMARY OF ONCOLOGIC HISTORY: Oncology History  Malignant neoplasm of upper-outer quadrant of left breast in female, estrogen receptor negative ([place])  03/20/1995 Initial Biopsy   Head and Neck Cancer   02/09/2016 Initial Diagnosis   Left breast biopsy 2:00: IDC, left axillary lymph node biopsy negative, grade 3, ER 0%, PR 0%, Ki-67 40%, HER-2 negative ratio 1.31, screening detected left breast mass 1.8 cm, left axillary lymph node 5 mm benign, T1 CN 0 stage IA clinical stage   03/13/2016 Surgery   Left lumpectomy: IDC 2 cm, margins negative, 0/2 lymph nodes, grade 3, ER 0%, PR 0%, HER-2 negative ratio 1.31, Ki-67 40%, T1 CN 0 stage IA   04/10/2016 - 07/03/2016 Chemotherapy   Adjuvant chemotherapy with dose dense Adriamycin and Cytoxan followed by Taxol weekly 7, stopped early due to neuropathy   08/29/2016 - 09/20/2016 Radiation Therapy   Adjuvant radiation: The left breast received 40.05 Gy in 15 treatments, and the left breast was boosted 10 Gy in 5 treatments. (received treatment in [place]).   10/10/2016 Genetic Testing   Genetic testing did not reveal a pathogenic mutation.  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 STK11, TP53, TSC1, TSC2, and VHL.  The following genes were evaluated for sequence changes only: SDHA and HOXB13 c.251G>A variant only.          CHIEF COMPLIANT: Follow-up of triple negative breast cancer on surveillance  INTERVAL HISTORY: Dell Hurtubise is a 49 y.o. with above-mentioned history of triple negative breast cancer treated with lumpectomy, adjuvant chemotherapy. and radiation. Mammogram on 02/16/19 showed calcifications in the left breast spanning 1.7cm. Biopsy on 02/23/19 showed focal atypical ductal hyperplasia. She underwent a lumpectomy on 04/09/19 with Dr. Brantley Stage for which pathology showed no evidence of malignancy. She presents to the clinic today for annual follow-up.   ALLERGIES:  has No Known Allergies.  MEDICATIONS:  Current Outpatient Medications  Medication Sig Dispense Refill  . albuterol (PROVENTIL HFA;VENTOLIN HFA) 108 (90 Base) MCG/ACT inhaler Inhale 2 puffs into the lungs every 6 (six) hours as needed for wheezing or shortness of breath. 1 Inhaler 2  . amLODipine (NORVASC) 10 MG tablet Take 10 mg by mouth daily.    . chlorthalidone (HYGROTON) 25 MG tablet     . cholecalciferol (VITAMIN D) 1000 units tablet Take 1,000 Units by mouth daily.    . ferrous gluconate (FERGON) 324 MG tablet ferrous gluconate 324 mg (38 mg iron) tablet  Take 1 tablet every day by oral route as directed for 90 days.    . fluticasone (FLONASE) 50 MCG/ACT nasal spray Place into both nostrils daily.    Marland Kitchen gabapentin (  NEURONTIN) 100 MG capsule Take 1 capsule by mouth once daily at bedtime 90 capsule 0  . HYDROcodone-acetaminophen (NORCO/VICODIN) 5-325 MG tablet Take 1 tablet by mouth every 6 (six) hours as needed for moderate pain. 15 tablet 0  . ibuprofen (ADVIL) 800 MG tablet Take 1 tablet (800 mg  total) by mouth every 8 (eight) hours as needed. 30 tablet 0  . levothyroxine (SYNTHROID, LEVOTHROID) 175 MCG tablet     . loratadine (CLARITIN) 10 MG tablet Take 10 mg by mouth daily.    Marland Kitchen losartan (COZAAR) 100 MG tablet 50 mg.     . Naproxen Sodium (ALEVE) 220 MG CAPS Take 1 capsule by mouth. As needed for pain    . potassium chloride 20 MEQ/15ML (10%) SOLN Take 7.5 mLs (10 mEq total) by mouth 2 (two) times daily. 240 mL 1  . promethazine (PHENERGAN) 25 MG/ML injection Inject 0.25-0.5 mLs (6.25-12.5 mg total) into the vein every 15 (fifteen) minutes as needed for nausea. 1 mL 0   No current facility-administered medications for this visit.   Facility-Administered Medications Ordered in Other Visits  Medication Dose Route Frequency Provider Last Rate Last Admin  . sodium chloride flush (NS) 0.9 % injection 10 mL  10 mL Intravenous PRN [name], MD   10 mL at 04/12/16 1512    PHYSICAL EXAMINATION: ECOG PERFORMANCE STATUS: 1 - Symptomatic but completely ambulatory  There were no vitals filed for this visit. There were no vitals filed for this visit.  BREAST: No palpable masses or nodules in either right or left breasts. No palpable axillary supraclavicular or infraclavicular adenopathy no breast tenderness or nipple discharge. (exam performed in the presence of a chaperone)  LABORATORY DATA:  I have reviewed the data as listed CMP Latest Ref Rng & Units 04/09/2019 04/07/2019 01/10/2017  Glucose 70 - 99 mg/dL 91 94 87  BUN 6 - 20 mg/dL _0 Creatinine 0.44 - 1.00 mg/dL 0.95 0.85 0.87  Sodium 135 - 145 mmol/L 139 137 142  Potassium 3.5 - 5.1 mmol/L 4.2 2.8(L) 3.1(L)  Chloride 98 - 111 mmol/L 99 99 101  CO2 22 - 32 mmol/L 29 28 33(H)  Calcium 8.9 - 10.3 mg/dL 9.6 8.9 9.5  Total Protein 6.5 - 8.1 g/dL - 7.0 7.1  Total Bilirubin 0.3 - 1.2 mg/dL - 0.5 0.4  Alkaline Phos 38 - 126 U/L - 79 102  AST 15 - 41 U/L - 52(H) 38(H)  ALT 0 - 44 U/L - 21 19    Lab Results  Component Value  Date   WBC 5.8 04/07/2019   HGB 13.1 04/07/2019   HCT 40.6 04/07/2019   MCV 87.5 04/07/2019   PLT 280 04/07/2019   NEUTROABS 4.2 04/07/2019    ASSESSMENT & PLAN:  Malignant neoplasm of upper-outer quadrant of left breast in female, estrogen receptor negative ([place]) Left lumpectomy 03/08/2016: IDC 2 cm, margins negative, 0/2 lymph nodes, grade 3, ER 0%, PR 0%, HER-2 negative ratio 1.31, Ki-67 40%, T1 CN 0 stage IA  Treatment plan: 1. Adjuvant chemotherapy with dose dense Adriamycin and Cytoxan 4 followed by weekly Taxol 7started 04/17/2016- 07/03/16 [place]. [place] ----------------------------------------------------------------------------------------------------------------------------------------- Breast cancer surveillance 1. Breast exam7/21/21 No palpable lumps or nodules of concern 2. Mammogram2/15/21: Grouped heterogenous calcs 1.7 cm 04/09/2019: left lumpectomy: benign  Chemo induced peripheral Neuropathy: I increase her gabapentin to 300 mg p.o. 3 times daily especially on days when she has lots of neuropathy.  Her neuropathy  is very mild she can go back to taking 300 mg once a day.  Her sister had breast cancer and underwent double mastectomy Knee Arthritis   RTC in 1 year    No orders of the defined types were placed in this encounter.  The patient has a good understanding of the overall plan. she agrees with it. she will call with any problems that may develop before the next visit here.  Total time spent: 20 mins including face to face time and time spent for planning, charting and coordination of care  [name], MD 07/23/2019  I, Cloyde Reams Dorshimer, am acting as scribe for Dr. [name].  I have reviewed the above documentation for accuracy and completeness, and I agree with the above.

## 2019-07-22 NOTE — Assessment & Plan Note (Signed)
Left lumpectomy 03/08/2016: IDC 2 cm, margins negative, 0/2 lymph nodes, grade 3, ER 0%, PR 0%, HER-2 negative ratio 1.31, Ki-67 40%, T1 CN 0 stage IA  Treatment plan: 1. Adjuvant chemotherapy with dose dense Adriamycin and Cytoxan 4 followed by weekly Taxol 7started 04/17/2016- 07/03/16 2. [place] ----------------------------------------------------------------------------------------------------------------------------------------- Breast cancer surveillance 1. Breast exam7/21/21 No palpable lumps or nodules of concern 2. Mammogram2/15/21: Grouped heterogenous calcs 1.7 cm 04/09/2019: left lumpectomy: benign  Chemo induced peripheral Neuropathy: Sent for Gabapentin Her sister had breast cancer and underwent double mastectomy recently. Knee Arthritis: On Diclofenac RTC in 1 year

## 2019-07-23 ENCOUNTER — Other Ambulatory Visit: Payer: Self-pay

## 2019-07-23 ENCOUNTER — Inpatient Hospital Stay: Payer: BC Managed Care – PPO | Attending: Hematology and Oncology | Admitting: Hematology and Oncology

## 2019-07-23 DIAGNOSIS — C50412 Malignant neoplasm of upper-outer quadrant of left female breast: Secondary | ICD-10-CM

## 2019-07-23 DIAGNOSIS — Z171 Estrogen receptor negative status [ER-]: Secondary | ICD-10-CM | POA: Diagnosis not present

## 2019-07-23 DIAGNOSIS — Z79899 Other long term (current) drug therapy: Secondary | ICD-10-CM | POA: Insufficient documentation

## 2019-07-23 DIAGNOSIS — Z853 Personal history of malignant neoplasm of breast: Secondary | ICD-10-CM | POA: Insufficient documentation

## 2019-07-23 MED ORDER — POTASSIUM CHLORIDE ER 10 MEQ PO TBCR: 10.0000 meq | EXTENDED_RELEASE_TABLET | Freq: Every day | ORAL | Status: AC

## 2019-07-23 MED ORDER — GABAPENTIN 300 MG PO CAPS: 300.0000 mg | ORAL_CAPSULE | Freq: Three times a day (TID) | ORAL | 3 refills | Status: DC

## 2020-04-25 ENCOUNTER — Encounter (HOSPITAL_COMMUNITY): Payer: Self-pay

## 2020-06-20 ENCOUNTER — Other Ambulatory Visit: Payer: Self-pay | Admitting: Surgery

## 2020-06-20 DIAGNOSIS — Z1231 Encounter for screening mammogram for malignant neoplasm of breast: Secondary | ICD-10-CM

## 2020-06-27 ENCOUNTER — Other Ambulatory Visit: Payer: Self-pay | Admitting: Surgery

## 2020-06-27 DIAGNOSIS — Z1231 Encounter for screening mammogram for malignant neoplasm of breast: Secondary | ICD-10-CM

## 2020-07-18 ENCOUNTER — Other Ambulatory Visit: Payer: Self-pay | Admitting: Surgery

## 2020-07-18 DIAGNOSIS — Z853 Personal history of malignant neoplasm of breast: Secondary | ICD-10-CM

## 2020-07-20 NOTE — Progress Notes (Signed)
Patient Care Team: [name], DO as PCP - General (Family Medicine) [name], MD as Consulting Physician (General Surgery) [name], MD as Consulting Physician (Hematology and Oncology) [name], MD as Consulting Physician (Radiation Oncology) Gardenia Phlegm, NP as Nurse Practitioner (Hematology and Oncology) Jamey Reas, MD as Referring Physician (Specialist)  DIAGNOSIS:    ICD-10-CM   1. Malignant neoplasm of upper-outer quadrant of left breast in female, estrogen receptor negative ([place])  C50.412    Z17.1       SUMMARY OF ONCOLOGIC HISTORY: Oncology History  Malignant neoplasm of upper-outer quadrant of left breast in female, estrogen receptor negative ([place])  03/20/1995 Initial Biopsy   Head and Neck Cancer    02/09/2016 Initial Diagnosis   Left breast biopsy 2:00: IDC, left axillary lymph node biopsy negative, grade 3, ER 0%, PR 0%, Ki-67 40%, HER-2 negative ratio 1.31, screening detected left breast mass 1.8 cm, left axillary lymph node 5 mm benign, T1 CN 0 stage IA clinical stage    03/13/2016 Surgery   Left lumpectomy: IDC 2 cm, margins negative, 0/2 lymph nodes, grade 3, ER 0%, PR 0%, HER-2 negative ratio 1.31, Ki-67 40%, T1 CN 0 stage IA    04/10/2016 - 07/03/2016 Chemotherapy   Adjuvant chemotherapy with dose dense Adriamycin and Cytoxan followed by Taxol weekly 7, stopped early due to neuropathy    08/29/2016 - 09/20/2016 Radiation Therapy   Adjuvant radiation: The left breast received 40.05 Gy in 15 treatments, and the left breast was boosted 10 Gy in 5 treatments. (received treatment in [place]).    10/10/2016 Genetic Testing   Genetic testing did not reveal a pathogenic mutation.  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 STK11, TP53, TSC1, TSC2, and VHL.  The following genes were evaluated for sequence changes only: SDHA and HOXB13 c.251G>A variant only.          CHIEF COMPLIANT:  Follow-up of triple negative breast cancer  INTERVAL HISTORY: Carla Pierce is a 50 y.o. with above-mentioned history of triple negative breast cancer treated with lumpectomy, adjuvant chemotherapy. and radiation. She presents to the clinic today for annual follow-up.  Denies any lumps or nodules in the breast.  Neuropathy appears to be mild.  Gabapentin is helping her significantly.  ALLERGIES:  has No Known Allergies.  MEDICATIONS:  Current Outpatient Medications  Medication Sig Dispense Refill   albuterol (PROVENTIL HFA;VENTOLIN HFA) 108 (90 Base) MCG/ACT inhaler Inhale 2 puffs into the lungs every 6 (six) hours as needed for wheezing or shortness of breath. 1 Inhaler 2   amLODipine (NORVASC) 10 MG tablet Take 10 mg by mouth daily.     chlorthalidone (HYGROTON) 25 MG tablet      cholecalciferol (VITAMIN D) 1000 units tablet Take 1,000 Units by mouth daily.     ferrous gluconate (FERGON) 324 MG tablet ferrous gluconate 324 mg (38 mg iron) tablet  Take 1 tablet every day by oral route as directed for 90 days.     fluticasone (FLONASE) 50 MCG/ACT nasal spray Place into both nostrils daily.     gabapentin (NEURONTIN) 300 MG capsule Take 1 capsule (300 mg total)  by mouth 3 (three) times daily. 90 capsule 3   ibuprofen (ADVIL) 800 MG tablet Take 1 tablet (800 mg total) by mouth every 8 (eight) hours as needed. 30 tablet 0   levothyroxine (SYNTHROID, LEVOTHROID) 175 MCG tablet      loratadine (CLARITIN) 10 MG tablet Take 10 mg by mouth daily.     losartan (COZAAR) 100 MG tablet 50 mg.      potassium chloride (KLOR-CON) 10  MEQ tablet Take 1 tablet (10 mEq total) by mouth daily.     No current facility-administered medications for this visit.   Facility-Administered Medications Ordered in Other Visits  Medication Dose Route Frequency Provider Last Rate Last Admin   sodium chloride flush (NS) 0.9 % injection 10 mL  10 mL Intravenous PRN [name], MD   10 mL at 04/12/16 1512    PHYSICAL EXAMINATION: ECOG PERFORMANCE STATUS: 1 - Symptomatic but completely ambulatory  Vitals:   07/21/20 1034  BP: 131/73  Pulse: 81  Resp: 18  Temp: 97.6 F (36.4 C)  SpO2: 99%   Filed Weights   07/21/20 1034  Weight: 284 lb (128.8 kg)    BREAST: No palpable masses or nodules in either right or left breasts. No palpable axillary supraclavicular or infraclavicular adenopathy no breast tenderness or nipple discharge. (exam performed in the presence of a chaperone)  LABORATORY DATA:  I have reviewed the data as listed CMP Latest Ref Rng & Units 04/09/2019 04/07/2019 01/10/2017  Glucose 70 - 99 mg/dL 91 94 87  BUN 6 - 20 mg/dL '12 18 13  ' Creatinine 0.44 - 1.00 mg/dL 0.95 0.85 0.87  Sodium 135 - 145 mmol/L 139 137 142  Potassium 3.5 - 5.1 mmol/L 4.2 2.8(L) 3.1(L)  Chloride 98 - 111 mmol/L 99 99 101  CO2 22 - 32 mmol/L 29 28 33(H)  Calcium 8.9 - 10.3 mg/dL 9.6 8.9 9.5  Total Protein 6.5 - 8.1 g/dL - 7.0 7.1  Total Bilirubin 0.3 - 1.2 mg/dL - 0.5 0.4  Alkaline Phos 38 - 126 U/L - 79 102  AST 15 - 41 U/L - 52(H) 38(H)  ALT 0 - 44 U/L - 21 19    Lab Results  Component Value Date   WBC 5.8 04/07/2019   HGB 13.1 04/07/2019   HCT 40.6 04/07/2019   MCV 87.5 04/07/2019   PLT 280 04/07/2019   NEUTROABS 4.2 04/07/2019    ASSESSMENT & PLAN:  Malignant neoplasm of upper-outer quadrant of left breast in female, estrogen receptor negative ([place]) Left lumpectomy 03/08/2016: IDC 2 cm, margins negative, 0/2 lymph nodes, grade 3, ER 0%, PR 0%, HER-2 negative ratio 1.31, Ki-67 40%, T1 CN 0 stage IA   Treatment plan: 1.  Adjuvant chemotherapy with dose dense Adriamycin and Cytoxan 4 followed by weekly Taxol 7  started 04/17/2016- 07/03/16 2. [place] completed: September 2018 ----------------------------------------------------------------------------------------------------------------------------------------- Breast cancer surveillance 1. Breast exam 07/21/20 No palpable lumps or nodules of concern 2. Mammogram scheduled for today  04/09/2019: left lumpectomy: benign   Chemo induced peripheral Neuropathy: She takes gabapentin twice a day and it appears to be helping her.  If she forgets to take it she gets more symptoms.  She also takes magnesium supplement which is helping   Her sister had breast cancer and underwent double mastectomy Knee Arthritis   RTC in 1 year and after that she can be seen on an as-needed basis.    No orders of the defined types were placed in  this encounter.  The patient has a good understanding of the overall plan. she agrees with it. she will call with any problems that may develop before the next visit here.  Total time spent: 20 mins including face to face time and time spent for planning, charting and coordination of care  Rulon Eisenmenger, MD, MPH 07/21/2020  I, Thana Ates, am acting as scribe for Dr. [name].  I have reviewed the above documentation for accuracy and completeness, and I agree with the above.

## 2020-07-21 ENCOUNTER — Inpatient Hospital Stay: Payer: BC Managed Care – PPO | Attending: Hematology and Oncology | Admitting: Hematology and Oncology

## 2020-07-21 ENCOUNTER — Ambulatory Visit
Admission: RE | Admit: 2020-07-21 | Discharge: 2020-07-21 | Disposition: A | Discharge status: Home / Self Care | Payer: BC Managed Care – PPO | Source: Ambulatory Visit | Attending: Surgery | Admitting: Surgery

## 2020-07-21 ENCOUNTER — Other Ambulatory Visit: Payer: Self-pay

## 2020-07-21 DIAGNOSIS — Z853 Personal history of malignant neoplasm of breast: Secondary | ICD-10-CM

## 2020-07-21 DIAGNOSIS — C50412 Malignant neoplasm of upper-outer quadrant of left female breast: Secondary | ICD-10-CM

## 2020-07-21 DIAGNOSIS — G620 Drug-induced polyneuropathy: Secondary | ICD-10-CM | POA: Insufficient documentation

## 2020-07-21 DIAGNOSIS — Z171 Estrogen receptor negative status [ER-]: Secondary | ICD-10-CM

## 2020-07-21 MED ORDER — GABAPENTIN 300 MG PO CAPS: 300.0000 mg | ORAL_CAPSULE | Freq: Three times a day (TID) | ORAL | 3 refills | Status: DC

## 2020-07-21 MED ORDER — MAGNESIUM OXIDE -MG SUPPLEMENT 400 (240 MG) MG PO TABS: 400.0000 mg | ORAL_TABLET | Freq: Every day | ORAL | Status: AC

## 2020-07-21 NOTE — Progress Notes (Signed)
UPDATE: TSC2 c.2995A>T (p.Ser999Cys) VUS was reclassified to Benign.  The amended report date is July 18, 2020.

## 2020-07-21 NOTE — Assessment & Plan Note (Signed)
Left lumpectomy 03/08/2016: IDC 2 cm, margins negative, 0/2 lymph nodes, grade 3, ER 0%, PR 0%, HER-2 negative ratio 1.31, Ki-67 40%, T1 CN 0 stage IA  Treatment plan: 1. Adjuvant chemotherapy with dose dense Adriamycin and Cytoxan 4 followed by weekly Taxol 7started 04/17/2016- 07/03/16 2. [place] ----------------------------------------------------------------------------------------------------------------------------------------- Breast cancer surveillance 1. Breast exam7/21/22 No palpable lumps or nodules of concern 2. Mammogram scheduled for today 04/09/2019: left lumpectomy: benign  Chemo induced peripheral Neuropathy: I increase her gabapentin to 300 mg p.o. 3 times daily especially on days when she has lots of neuropathy.  Her neuropathy is very mild she can go back to taking 300 mg once a day.  Her sister had breast cancer and underwent double mastectomy Knee Arthritis  RTC in 1 year

## 2020-07-22 ENCOUNTER — Encounter: Payer: Self-pay | Admitting: Surgery

## 2020-07-26 ENCOUNTER — Encounter: Payer: Self-pay | Admitting: Genetic Counselor

## 2021-06-23 ENCOUNTER — Other Ambulatory Visit: Payer: Self-pay | Admitting: Hematology and Oncology

## 2021-06-23 ENCOUNTER — Other Ambulatory Visit: Payer: Self-pay | Admitting: Surgery

## 2021-06-23 DIAGNOSIS — Z1231 Encounter for screening mammogram for malignant neoplasm of breast: Secondary | ICD-10-CM

## 2021-07-21 ENCOUNTER — Ambulatory Visit: Payer: BC Managed Care – PPO | Admitting: Hematology and Oncology

## 2021-07-26 NOTE — Progress Notes (Signed)
Patient Care Team: [name], DO as PCP - General (Family Medicine) [name], MD as Consulting Physician (General Surgery) [name], MD as Consulting Physician (Hematology and Oncology) [name], MD as Consulting Physician (Radiation Oncology) [name], NP as Nurse Practitioner (Hematology and Oncology) Jamey Reas, MD as Referring Physician (Specialist)  DIAGNOSIS: No diagnosis found.  SUMMARY OF ONCOLOGIC HISTORY: Oncology History  Malignant neoplasm of upper-outer quadrant of left breast in female, estrogen receptor negative ([place])  03/20/1995 Initial Biopsy   Head and Neck Cancer   02/09/2016 Initial Diagnosis   Left breast biopsy 2:00: IDC, left axillary lymph node biopsy negative, grade 3, ER 0%, PR 0%, Ki-67 40%, HER-2 negative ratio 1.31, screening detected left breast mass 1.8 cm, left axillary lymph node 5 mm benign, T1 CN 0 stage IA clinical stage   03/13/2016 Surgery   Left lumpectomy: IDC 2 cm, margins negative, 0/2 lymph nodes, grade 3, ER 0%, PR 0%, HER-2 negative ratio 1.31, Ki-67 40%, T1 CN 0 stage IA   04/10/2016 - 07/03/2016 Chemotherapy   Adjuvant chemotherapy with dose dense Adriamycin and Cytoxan followed by Taxol weekly 7, stopped early due to neuropathy   08/29/2016 - 09/20/2016 Radiation Therapy   Adjuvant radiation: The left breast received 40.05 Gy in 15 treatments, and the left breast was boosted 10 Gy in 5 treatments. (received treatment in [place]).   10/10/2016 Genetic Testing   Genetic testing did not reveal a pathogenic mutation.  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 STK11, TP53, TSC1, TSC2, and VHL.  The following genes were evaluated for sequence changes only: SDHA and HOXB13 c.251G>A variant only.     UPDATE: TSC2 c.2995A>T (p.Ser999Cys) VUS was reclassified to Benign.  The amended report date is July 18, 2020.       CHIEF COMPLIANT:   INTERVAL HISTORY: Carla Pierce is a   ALLERGIES:  has No Known Allergies.  MEDICATIONS:  Current Outpatient Medications  Medication Sig Dispense Refill   albuterol (PROVENTIL HFA;VENTOLIN HFA) 108 (90 Base) MCG/ACT inhaler Inhale 2 puffs into the lungs every 6 (six) hours as needed for wheezing or shortness of breath. 1 Inhaler 2   amLODipine (NORVASC) 10 MG tablet Take 10 mg by mouth daily.     chlorthalidone (HYGROTON) 25 MG tablet      cholecalciferol (VITAMIN D) 1000 units tablet Take 1,000 Units by mouth daily.     ferrous gluconate (FERGON) 324 MG tablet ferrous gluconate 324 mg (38 mg iron) tablet  Take 1 tablet every day by oral route as directed for 90 days.     fluticasone (FLONASE) 50 MCG/ACT nasal spray Place into both nostrils daily.     gabapentin (NEURONTIN) 300 MG capsule TAKE 1 CAPSULE BY MOUTH THREE TIMES DAILY 90 capsule 0   ibuprofen (ADVIL) 800 MG tablet Take 1 tablet (800 mg total) by mouth every 8 (eight) hours as needed. 30 tablet 0   levothyroxine (SYNTHROID, LEVOTHROID) 175 MCG tablet      loratadine (CLARITIN) 10 MG tablet Take 10 mg by mouth daily.     losartan (COZAAR) 100 MG tablet 50 mg.  magnesium oxide (MAG-OX) 400 (240 Mg) MG tablet Take 1 tablet (400 mg total) by mouth daily.     potassium chloride (KLOR-CON) 10 MEQ tablet Take 1 tablet (10 mEq total) by mouth daily.     No current facility-administered medications for this visit.   Facility-Administered Medications Ordered in Other Visits  Medication Dose Route Frequency Provider Last Rate Last Admin   sodium chloride flush (NS) 0.9 % injection  10 mL  10 mL Intravenous PRN [name], MD   10 mL at 04/12/16 1512    PHYSICAL EXAMINATION: ECOG PERFORMANCE STATUS: {CHL ONC ECOG PS:[phone]}  There were no vitals filed for this visit. There were no vitals filed for this visit.  BREAST:*** No palpable masses or nodules in either right or left breasts. No palpable axillary supraclavicular or infraclavicular adenopathy no breast tenderness or nipple discharge. (exam performed in the presence of a chaperone)  LABORATORY DATA:  I have reviewed the data as listed    Latest Ref Rng & Units 04/09/2019   11:23 AM 04/07/2019   10:56 AM 01/10/2017   10:30 AM  CMP  Glucose 70 - 99 mg/dL 91  94  87   BUN 6 - 20 mg/dL '12  18  13   ' Creatinine 0.44 - 1.00 mg/dL 0.95  0.85  0.87   Sodium 135 - 145 mmol/L 139  137  142   Potassium 3.5 - 5.1 mmol/L 4.2  2.8  3.1   Chloride 98 - 111 mmol/L 99  99  101   CO2 22 - 32 mmol/L 29  28  33   Calcium 8.9 - 10.3 mg/dL 9.6  8.9  9.5   Total Protein 6.5 - 8.1 g/dL  7.0  7.1   Total Bilirubin 0.3 - 1.2 mg/dL  0.5  0.4   Alkaline Phos 38 - 126 U/L  79  102   AST 15 - 41 U/L  52  38   ALT 0 - 44 U/L  21  19     Lab Results  Component Value Date   WBC 5.8 04/07/2019   HGB 13.1 04/07/2019   HCT 40.6 04/07/2019   MCV 87.5 04/07/2019   PLT 280 04/07/2019   NEUTROABS 4.2 04/07/2019    ASSESSMENT & PLAN:  No problem-specific Assessment & Plan notes found for this encounter.    No orders of the defined types were placed in this encounter.  The patient has a good understanding of the overall plan. she agrees with it. she will call with any problems that may develop before the next visit here. Total time spent: 30 mins including face to face time and time spent for planning, charting and co-ordination of care   Suzzette Righter, [place] 07/26/21    I Gardiner Coins am scribing for Dr. Lindi Adie  ***

## 2021-07-27 ENCOUNTER — Inpatient Hospital Stay: Payer: BC Managed Care – PPO | Attending: Hematology and Oncology | Admitting: Hematology and Oncology

## 2021-07-27 ENCOUNTER — Ambulatory Visit
Admission: RE | Admit: 2021-07-27 | Discharge: 2021-07-27 | Disposition: A | Discharge status: Home / Self Care | Payer: BC Managed Care – PPO | Source: Ambulatory Visit | Attending: Surgery | Admitting: Surgery

## 2021-07-27 DIAGNOSIS — Z171 Estrogen receptor negative status [ER-]: Secondary | ICD-10-CM | POA: Diagnosis not present

## 2021-07-27 DIAGNOSIS — Z08 Encounter for follow-up examination after completed treatment for malignant neoplasm: Secondary | ICD-10-CM | POA: Diagnosis not present

## 2021-07-27 DIAGNOSIS — C50412 Malignant neoplasm of upper-outer quadrant of left female breast: Secondary | ICD-10-CM

## 2021-07-27 DIAGNOSIS — Z1231 Encounter for screening mammogram for malignant neoplasm of breast: Secondary | ICD-10-CM

## 2021-07-27 DIAGNOSIS — G620 Drug-induced polyneuropathy: Secondary | ICD-10-CM | POA: Diagnosis not present

## 2021-07-27 DIAGNOSIS — Z853 Personal history of malignant neoplasm of breast: Secondary | ICD-10-CM | POA: Insufficient documentation

## 2021-07-27 NOTE — Assessment & Plan Note (Signed)
Left lumpectomy 03/08/2016: IDC 2 cm, margins negative, 0/2 lymph nodes, grade 3, ER 0%, PR 0%, HER-2 negative ratio 1.31, Ki-67 40%, T1 CN 0 stage IA  Treatment plan: 1. Adjuvant chemotherapy with dose dense Adriamycin and Cytoxan 4 followed by weekly Taxol 7started 04/17/2016- 07/03/16 2. [place] ----------------------------------------------------------------------------------------------------------------------------------------- Breast cancer surveillance 1. Breast exam7/27/2023no palpable lumps or nodules of concern 2. Mammogram scheduled for today 04/09/2019: left lumpectomy: benign  Chemo induced peripheral Neuropathy:She takes gabapentin twice a day and it appears to be helping her.  If she forgets to take it she gets more symptoms.  She also takes magnesium supplement which is helping  Her sister had breast cancer and underwent double mastectomy Knee Arthritis  RTC on an as-needed basis 

## 2021-07-27 NOTE — Research (Signed)
Trial:  [id number] - TREATMENT OF ESTABLISHED CHEMOTHERAPY-INDUCED NEUROPATHY WITH N-PALMITOYLETHANOLAMIDE, A CANNABIMIMETIC NUTRACEUTICAL: A RANDOMIZED DOUBLE-BLIND PHASE II PILOT TRIAL Patient Carla Pierce was identified by Dr Lindi Adie as a potential candidate for the above listed study.  This Clinical Research Nurse met with Carla Pierce, [id number], on 07/27/21 in a manner and location that ensures patient privacy to discuss participation in the above listed research study.  Patient is Unaccompanied.  A copy of the informed consent document with embedded HIPAA language was provided to the patient.  Patient reads, speaks, and understands [place].   Patient was provided with the business card of this Nurse and encouraged to contact the research team with any questions.  Approximately 15 minutes were spent with the patient reviewing the informed consent documents.  Patient was provided the option of taking informed consent documents home to review and was encouraged to review at their convenience with their support network, including other care providers. Patient took the consent documents home to review. Patient agreed to follow up by the research team on Monday 07/31/21. Confirmed phone number [phone]. She reports neuropathy to her fingers and toes for five years and has not had significant improvement with gabapentin.   Carla Pierce Carla Monaco, RN, BSN, [facility] She  Her  Hers Clinical Research Nurse [facility] Direct Dial [phone]  Pager [phone] 07/27/2021 3:30 PM

## 2021-07-31 ENCOUNTER — Telehealth: Payer: Self-pay

## 2021-07-31 NOTE — Telephone Encounter (Signed)
ACCRU-[id number] - TREATMENT OF ESTABLISHED CHEMOTHERAPY-INDUCED NEUROPATHY WITH N-PALMITOYLETHANOLAMIDE, A CANNABIMIMETIC NUTRACEUTICAL: A RANDOMIZED DOUBLE-BLIND PHASE II PILOT TRIAL  Called patient to follow up on referral from last week. No answer, left voicemail.   Marjie Skiff Massie Cogliano, RN, BSN, [facility] She  Her  Hers Clinical Research Nurse [facility] Direct Dial [phone]  Pager [phone] 07/31/2021 3:45 PM

## 2021-08-08 ENCOUNTER — Other Ambulatory Visit: Payer: Self-pay | Admitting: Hematology and Oncology

## 2021-08-08 MED ORDER — GABAPENTIN 300 MG PO CAPS: 300.0000 mg | ORAL_CAPSULE | Freq: Three times a day (TID) | ORAL | 3 refills | Status: DC

## 2021-08-10 ENCOUNTER — Other Ambulatory Visit: Payer: Self-pay | Admitting: *Deleted

## 2021-08-10 MED ORDER — GABAPENTIN 300 MG PO CAPS: 300.0000 mg | ORAL_CAPSULE | Freq: Three times a day (TID) | ORAL | 3 refills | Status: DC

## 2021-08-10 NOTE — Telephone Encounter (Signed)
Received call from pt with request for refill on Gabapentin.  Due to pharmacy location in [place], [place] had to fax signed prescription.  Prescription was successfully faxed to [facility] in [place], [place].

## 2021-08-17 ENCOUNTER — Other Ambulatory Visit: Payer: Self-pay | Admitting: *Deleted

## 2021-08-17 MED ORDER — GABAPENTIN 300 MG PO CAPS: 300.0000 mg | ORAL_CAPSULE | Freq: Three times a day (TID) | ORAL | 3 refills | Status: DC

## 2022-04-13 ENCOUNTER — Other Ambulatory Visit: Payer: Self-pay | Admitting: Hematology and Oncology

## 2022-04-13 MED ORDER — GABAPENTIN 300 MG PO CAPS: 300.0000 mg | ORAL_CAPSULE | Freq: Three times a day (TID) | ORAL | 3 refills | Status: DC

## 2022-04-13 NOTE — Telephone Encounter (Signed)
Refilled on 04/13/22

## 2022-07-18 ENCOUNTER — Other Ambulatory Visit: Payer: Self-pay | Admitting: Specialist

## 2022-07-18 DIAGNOSIS — R928 Other abnormal and inconclusive findings on diagnostic imaging of breast: Secondary | ICD-10-CM

## 2022-07-29 NOTE — Progress Notes (Signed)
Patient Care Team: Renaldo Harrison, DO as PCP - General (Family Medicine) Harriette Bouillon, MD as Consulting Physician (General Surgery) Serena Croissant, MD as Consulting Physician (Hematology and Oncology) Dorothy Puffer, MD as Consulting Physician (Radiation Oncology) Axel Filler Larna Daughters, NP as Nurse Practitioner (Hematology and Oncology) Jeannett Senior, MD as Referring Physician (Specialist)  DIAGNOSIS: No diagnosis found.  SUMMARY OF ONCOLOGIC HISTORY: Oncology History  Malignant neoplasm of upper-outer quadrant of left breast in female, estrogen receptor negative (HCC)  03/20/1995 Initial Biopsy   Head and Neck Cancer   02/09/2016 Initial Diagnosis   Left breast biopsy 2:00: IDC, left axillary lymph node biopsy negative, grade 3, ER 0%, PR 0%, Ki-67 40%, HER-2 negative ratio 1.31, screening detected left breast mass 1.8 cm, left axillary lymph node 5 mm benign, T1 CN 0 stage IA clinical stage   03/13/2016 Surgery   Left lumpectomy: IDC 2 cm, margins negative, 0/2 lymph nodes, grade 3, ER 0%, PR 0%, HER-2 negative ratio 1.31, Ki-67 40%, T1 CN 0 stage IA   04/10/2016 - 07/03/2016 Chemotherapy   Adjuvant chemotherapy with dose dense Adriamycin and Cytoxan followed by Taxol weekly 7, stopped early due to neuropathy   08/29/2016 - 09/20/2016 Radiation Therapy   Adjuvant radiation: The left breast received 40.05 Gy in 15 treatments, and the left breast was boosted 10 Gy in 5 treatments. (received treatment in [place]).   10/10/2016 Genetic Testing   Genetic testing did not reveal a pathogenic mutation.  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 STK11, TP53, TSC1, TSC2, and VHL.  The following genes were evaluated for sequence changes only: SDHA and HOXB13 c.251G>A variant only.     UPDATE: TSC2 c.2995A>T (p.Ser999Cys) VUS was reclassified to Benign.  The amended report date is July 18, 2020.       CHIEF COMPLIANT: Follow-up of triple negative breast cancer    INTERVAL HISTORY: Carla Pierce is a 52 y.o. with above-mentioned history of triple negative breast cancer treated with lumpectomy, adjuvant chemotherapy. and radiation. She presents to the clinic today for a follow-up.     ALLERGIES:  has No Known Allergies.  MEDICATIONS:  Current Outpatient Medications  Medication Sig Dispense Refill   amLODipine (NORVASC) 10 MG tablet Take 10 mg by mouth daily.     chlorthalidone (HYGROTON) 25 MG tablet      cholecalciferol (VITAMIN D) 1000 units tablet Take 1,000 Units by mouth daily.     ferrous gluconate (FERGON) 324 MG tablet ferrous gluconate 324 mg (38 mg iron) tablet  Take 1 tablet every day by oral route as directed for 90 days.     fluticasone (FLONASE) 50 MCG/ACT nasal spray Place into both nostrils daily.     gabapentin (NEURONTIN) 300 MG capsule Take 1 capsule (300 mg total) by mouth 3 (three) times daily. 270 capsule 3   levothyroxine (SYNTHROID) 25 MCG tablet Take 25 mcg by mouth daily before breakfast.     levothyroxine (SYNTHROID, LEVOTHROID) 175 MCG tablet      loratadine (CLARITIN) 10 MG tablet Take 10 mg by mouth daily.     losartan (COZAAR) 100 MG tablet 50 mg.  magnesium oxide (MAG-OX) 400 (240 Mg) MG tablet Take 1 tablet (400 mg total) by mouth daily.     meloxicam (MOBIC) 15 MG tablet Take 15 mg by mouth daily.     potassium chloride (KLOR-CON) 10 MEQ tablet Take 1 tablet (10 mEq total) by mouth daily.     No current facility-administered medications for this visit.   Facility-Administered Medications Ordered in Other Visits   Medication Dose Route Frequency Provider Last Rate Last Admin   sodium chloride flush (NS) 0.9 % injection 10 mL  10 mL Intravenous PRN Serena Croissant, MD   10 mL at 04/12/16 1512    PHYSICAL EXAMINATION: ECOG PERFORMANCE STATUS: {CHL ONC ECOG PS:[id number]}  There were no vitals filed for this visit. There were no vitals filed for this visit.  BREAST:*** No palpable masses or nodules in either right or left breasts. No palpable axillary supraclavicular or infraclavicular adenopathy no breast tenderness or nipple discharge. (exam performed in the presence of a chaperone)  LABORATORY DATA:  I have reviewed the data as listed    Latest Ref Rng & Units 04/09/2019   11:23 AM 04/07/2019   10:56 AM 01/10/2017   10:30 AM  CMP  Glucose 70 - 99 mg/dL 91  94  87   BUN 6 - 20 mg/dL 12  18  13    Creatinine 0.44 - 1.00 mg/dL 0.98  1.19  1.47   Sodium 135 - 145 mmol/L 139  137  142   Potassium 3.5 - 5.1 mmol/L 4.2  2.8  3.1   Chloride 98 - 111 mmol/L 99  99  101   CO2 22 - 32 mmol/L 29  28  33   Calcium 8.9 - 10.3 mg/dL 9.6  8.9  9.5   Total Protein 6.5 - 8.1 g/dL  7.0  7.1   Total Bilirubin 0.3 - 1.2 mg/dL  0.5  0.4   Alkaline Phos 38 - 126 U/L  79  102   AST 15 - 41 U/L  52  38   ALT 0 - 44 U/L  21  19     Lab Results  Component Value Date   WBC 5.8 04/07/2019   HGB 13.1 04/07/2019   HCT 40.6 04/07/2019   MCV 87.5 04/07/2019   PLT 280 04/07/2019   NEUTROABS 4.2 04/07/2019    ASSESSMENT & PLAN:  No problem-specific Assessment & Plan notes found for this encounter.    No orders of the defined types were placed in this encounter.  The patient has a good understanding of the overall plan. she agrees with it. she will call with any problems that may develop before the next visit here. Total time spent: 30 mins including face to face time and time spent for planning, charting and co-ordination of care   Sherlyn Lick, CMA 07/29/22    I Janan Ridge am acting as a Neurosurgeon  for [employer]  ***

## 2022-08-02 ENCOUNTER — Ambulatory Visit: Payer: BC Managed Care – PPO

## 2022-08-02 ENCOUNTER — Other Ambulatory Visit: Payer: Self-pay | Admitting: Specialist

## 2022-08-02 ENCOUNTER — Ambulatory Visit
Admission: RE | Admit: 2022-08-02 | Discharge: 2022-08-02 | Disposition: A | Discharge status: Home / Self Care | Payer: BC Managed Care – PPO | Source: Ambulatory Visit | Attending: Specialist | Admitting: Specialist

## 2022-08-02 ENCOUNTER — Inpatient Hospital Stay: Payer: BC Managed Care – PPO | Attending: Hematology and Oncology | Admitting: Hematology and Oncology

## 2022-08-02 ENCOUNTER — Other Ambulatory Visit: Payer: Self-pay

## 2022-08-02 VITALS — BP 126/78 | HR 83 | Temp 97.7°F | Resp 18 | Ht 69.0 in | Wt 268.2 lb

## 2022-08-02 DIAGNOSIS — C50412 Malignant neoplasm of upper-outer quadrant of left female breast: Secondary | ICD-10-CM | POA: Insufficient documentation

## 2022-08-02 DIAGNOSIS — Z171 Estrogen receptor negative status [ER-]: Secondary | ICD-10-CM | POA: Insufficient documentation

## 2022-08-02 DIAGNOSIS — R928 Other abnormal and inconclusive findings on diagnostic imaging of breast: Secondary | ICD-10-CM

## 2022-08-02 DIAGNOSIS — G620 Drug-induced polyneuropathy: Secondary | ICD-10-CM | POA: Insufficient documentation

## 2022-08-02 DIAGNOSIS — R921 Mammographic calcification found on diagnostic imaging of breast: Secondary | ICD-10-CM

## 2022-08-02 DIAGNOSIS — T451X5A Adverse effect of antineoplastic and immunosuppressive drugs, initial encounter: Secondary | ICD-10-CM | POA: Diagnosis not present

## 2022-08-02 MED ORDER — GABAPENTIN 300 MG PO CAPS: 300.0000 mg | ORAL_CAPSULE | Freq: Every day | ORAL | 3 refills | Status: DC

## 2022-08-02 MED ORDER — GABAPENTIN 300 MG PO CAPS: 300.0000 mg | ORAL_CAPSULE | Freq: Every day | ORAL | Status: DC

## 2022-08-02 NOTE — Assessment & Plan Note (Addendum)
Left lumpectomy 03/08/2016: IDC 2 cm, margins negative, 0/2 lymph nodes, grade 3, ER 0%, PR 0%, HER-2 negative ratio 1.31, Ki-67 40%, T1 CN 0 stage IA   Treatment plan: 1. Adjuvant chemotherapy with dose dense Adriamycin and Cytoxan 4 followed by weekly Taxol 7  started 04/17/2016- 07/03/16 2. Followed by radiation therapy in [place] completed: September 2018 ----------------------------------------------------------------------------------------------------------------------------------------- Breast cancer surveillance 1. Breast exam 08/02/2022: Scar tissue from prior surgeries palpable 2. Mammogram: At [facility] showed calcifications.  She is going to have a biopsy done next week.  04/09/2019: left lumpectomy: benign   Chemo induced peripheral Neuropathy: Currently takes gabapentin once a day at bedtime.  I sent a new prescription today.  Knee Arthritis   RTC in 1 year for follow-up

## 2022-08-02 NOTE — Progress Notes (Signed)
Mammogram report request faxed to [facility] in [place], [place] at [phone] with receipt confirmation. Attempted several calls to [phone] but line is disconnected.

## 2022-08-07 ENCOUNTER — Encounter: Payer: Self-pay | Admitting: Specialist

## 2022-08-09 ENCOUNTER — Ambulatory Visit
Admission: RE | Admit: 2022-08-09 | Discharge: 2022-08-09 | Disposition: A | Discharge status: Home / Self Care | Payer: BC Managed Care – PPO | Source: Ambulatory Visit | Attending: Specialist | Admitting: Specialist

## 2022-08-09 DIAGNOSIS — R921 Mammographic calcification found on diagnostic imaging of breast: Secondary | ICD-10-CM

## 2022-08-09 HISTORY — PX: BREAST BIOPSY: SHX20

## 2022-11-21 ENCOUNTER — Encounter: Payer: Self-pay | Admitting: Hematology and Oncology

## 2022-11-21 NOTE — Telephone Encounter (Signed)
Telephone call  

## 2023-07-17 ENCOUNTER — Ambulatory Visit (HOSPITAL_COMMUNITY): Admit: 2023-07-17 | Admitting: Orthopedic Surgery

## 2023-07-17 SURGERY — ARTHROPLASTY, HIP, TOTAL, ANTERIOR APPROACH
Anesthesia: Choice | Site: Hip | Laterality: Left

## 2023-08-07 NOTE — Assessment & Plan Note (Signed)
 Left lumpectomy 03/08/2016: IDC 2 cm, margins negative, 0/2 lymph nodes, grade 3, ER 0%, PR 0%, HER-2 negative ratio 1.31, Ki-67 40%, T1 CN 0 stage IA   Treatment plan: 1. Adjuvant chemotherapy with dose dense Adriamycin  and Cytoxan  4 followed by weekly Taxol  7  started 04/17/2016- 07/03/16 2. Followed by radiation therapy in [place] completed: September 2018 ----------------------------------------------------------------------------------------------------------------------------------------- Breast cancer surveillance 1. Breast exam 08/08/2023: Scar tissue from prior surgeries palpable 2. Mammogram: At [facility] showed calcifications.  She is going to have a biopsy done next week.  04/09/2019: left lumpectomy: benign   Chemo induced peripheral Neuropathy: Currently takes gabapentin  once a day at bedtime.  I sent a new prescription today.   Knee Arthritis   RTC in 1 year for follow-up

## 2023-08-08 ENCOUNTER — Encounter: Payer: Self-pay | Admitting: Hematology and Oncology

## 2023-08-08 ENCOUNTER — Inpatient Hospital Stay: Payer: BC Managed Care – PPO | Attending: Hematology and Oncology | Admitting: Hematology and Oncology

## 2023-08-08 DIAGNOSIS — C50412 Malignant neoplasm of upper-outer quadrant of left female breast: Secondary | ICD-10-CM

## 2023-08-08 DIAGNOSIS — Z853 Personal history of malignant neoplasm of breast: Secondary | ICD-10-CM | POA: Diagnosis present

## 2023-08-08 DIAGNOSIS — Z79899 Other long term (current) drug therapy: Secondary | ICD-10-CM | POA: Diagnosis not present

## 2023-08-08 DIAGNOSIS — Z171 Estrogen receptor negative status [ER-]: Secondary | ICD-10-CM

## 2023-08-08 DIAGNOSIS — T451X5A Adverse effect of antineoplastic and immunosuppressive drugs, initial encounter: Secondary | ICD-10-CM | POA: Diagnosis not present

## 2023-08-08 DIAGNOSIS — Z923 Personal history of irradiation: Secondary | ICD-10-CM | POA: Insufficient documentation

## 2023-08-08 DIAGNOSIS — G620 Drug-induced polyneuropathy: Secondary | ICD-10-CM | POA: Insufficient documentation

## 2023-08-08 MED ORDER — GABAPENTIN 300 MG PO CAPS: 300.0000 mg | ORAL_CAPSULE | Freq: Every day | ORAL | 3 refills | Status: DC

## 2023-08-08 NOTE — Progress Notes (Signed)
 Patient Care Team: Joella Sieving, DO as PCP - General (Family Medicine) Vanderbilt Ned, MD as Consulting Physician (General Surgery) Odean Potts, MD as Consulting Physician (Hematology and Oncology) Dewey Rush, MD as Consulting Physician (Radiation Oncology) Crawford Morna Pickle, NP as Nurse Practitioner (Hematology and Oncology) Gordan Bergeron, MD as Referring Physician (Specialist)  DIAGNOSIS:  Encounter Diagnosis  Name Primary?   Malignant neoplasm of upper-outer quadrant of left breast in female, estrogen receptor negative (HCC) Yes    SUMMARY OF ONCOLOGIC HISTORY: Oncology History  Malignant neoplasm of upper-outer quadrant of left breast in female, estrogen receptor negative (HCC)  03/20/1995 Initial Biopsy   Head and Neck Cancer   02/09/2016 Initial Diagnosis   Left breast biopsy 2:00: IDC, left axillary lymph node biopsy negative, grade 3, ER 0%, PR 0%, Ki-67 40%, HER-2 negative ratio 1.31, screening detected left breast mass 1.8 cm, left axillary lymph node 5 mm benign, T1 CN 0 stage IA clinical stage   03/13/2016 Surgery   Left lumpectomy: IDC 2 cm, margins negative, 0/2 lymph nodes, grade 3, ER 0%, PR 0%, HER-2 negative ratio 1.31, Ki-67 40%, T1 CN 0 stage IA   04/10/2016 - 07/03/2016 Chemotherapy   Adjuvant chemotherapy with dose dense Adriamycin  and Cytoxan  followed by Taxol  weekly 7, stopped early due to neuropathy   08/29/2016 - 09/20/2016 Radiation Therapy   Adjuvant radiation: The left breast received 40.05 Gy in 15 treatments, and the left breast was boosted 10 Gy in 5 treatments. (received treatment in [place] ).   10/10/2016 Genetic Testing   Genetic testing did not reveal a pathogenic mutation.  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  c.2995A>T (p.Ser999Cys)   Genes tested: APC, ATM, AXIN2, BARD1, BMPR1A, BRCA1, BRCA2, BRIP1, CDH1, CDKN2A (p14ARF), CDKN2A (p16INK4a), CHEK2, CTNNA1, DICER1, EPCAM (Deletion/duplication testing only), GREM1 (promoter  region deletion/duplication testing only), KIT, MEN1, MLH1, MSH2, MSH3, MSH6, MUTYH, NBN, NF1, NHTL1, PALB2, PDGFRA, PMS2, POLD1, POLE, PTEN, RAD50, RAD51C, RAD51D, SDHB, SDHC, SDHD, SMAD4, SMARCA4. STK11, TP53, TSC1, TSC2, and VHL.  The following genes were evaluated for sequence changes only: SDHA and HOXB13 c.251G>A variant only.     UPDATE: TSC2 c.2995A>T (p.Ser999Cys) VUS was reclassified to Benign.  The amended report date is July 18, 2020.       CHIEF COMPLIANT: Surveillance of breast cancer  HISTORY OF PRESENT ILLNESS:  History of Present Illness Carla Pierce is a 53 year old female who presents for follow-up regarding her upcoming surgery and medication management.  She is scheduled for surgery at [facility] in [place] , which has been rescheduled twice. She faces challenges in arranging for post-surgery care.  She experiences neuropathy, which is less severe than before. Her medication regimen includes gabapentin  once daily at night, along with tramadol and muscle relaxants.  She has a history of a benign breast condition and is due for a mammogram next month in [place], which was postponed due to her surgery.  She has lost 50 pounds following head and neck surgery, reducing her weight from 270 pounds. This weight loss is attributed to her own efforts.     ALLERGIES:  has no known allergies.  MEDICATIONS:  Current Outpatient Medications  Medication Sig Dispense Refill   amLODipine (NORVASC) 10 MG tablet Take 10 mg by mouth daily.     chlorthalidone (HYGROTON) 25 MG tablet      cholecalciferol (VITAMIN D) 1000 units tablet Take 1,000 Units by mouth daily.     ferrous gluconate (FERGON) 324 MG tablet  ferrous gluconate 324 mg (38 mg iron) tablet  Take 1 tablet every day by oral route as directed for 90 days.     fluticasone (FLONASE) 50 MCG/ACT nasal spray Place into both nostrils daily.     gabapentin  (NEURONTIN ) 300 MG capsule Take 1 capsule (300 mg total) by  mouth at bedtime. 90 capsule 3   levothyroxine (SYNTHROID) 200 MCG tablet Take 200 mcg by mouth daily.     loratadine (CLARITIN) 10 MG tablet Take 10 mg by mouth daily.     losartan (COZAAR) 100 MG tablet 50 mg.      magnesium  oxide (MAG-OX) 400 (240 Mg) MG tablet Take 1 tablet (400 mg total) by mouth daily.     meloxicam (MOBIC) 15 MG tablet Take 15 mg by mouth daily.     potassium chloride  (KLOR-CON ) 10 MEQ tablet Take 1 tablet (10 mEq total) by mouth daily.     Semaglutide-Weight Management (WEGOVY) 0.25 MG/0.5ML SOAJ Inject 0.25 mg every week by subcutaneous route as directed for 30 days.     No current facility-administered medications for this visit.   Facility-Administered Medications Ordered in Other Visits  Medication Dose Route Frequency Provider Last Rate Last Admin   sodium chloride  flush (NS) 0.9 % injection 10 mL  10 mL Intravenous PRN Odean Potts, MD   10 mL at 04/12/16 1512    PHYSICAL EXAMINATION: ECOG PERFORMANCE STATUS: 1 - Symptomatic but completely ambulatory  There were no vitals filed for this visit. There were no vitals filed for this visit.  Physical Exam MEASUREMENTS: Weight- 220 lbs.  (exam performed in the presence of a chaperone)  LABORATORY DATA:  I have reviewed the data as listed    Latest Ref Rng & Units 04/09/2019   11:23 AM 04/07/2019   10:56 AM 01/10/2017   10:30 AM  CMP  Glucose 70 - 99 mg/dL 91  94  87   BUN 6 - 20 mg/dL 12  18  13    Creatinine 0.44 - 1.00 mg/dL 9.04  9.14  9.12   Sodium 135 - 145 mmol/L 139  137  142   Potassium 3.5 - 5.1 mmol/L 4.2  2.8  3.1   Chloride 98 - 111 mmol/L 99  99  101   CO2 22 - 32 mmol/L 29  28  33   Calcium 8.9 - 10.3 mg/dL 9.6  8.9  9.5   Total Protein 6.5 - 8.1 g/dL  7.0  7.1   Total Bilirubin 0.3 - 1.2 mg/dL  0.5  0.4   Alkaline Phos 38 - 126 U/L  79  102   AST 15 - 41 U/L  52  38   ALT 0 - 44 U/L  21  19     Lab Results  Component Value Date   WBC 5.8 04/07/2019   HGB 13.1 04/07/2019   HCT  40.6 04/07/2019   MCV 87.5 04/07/2019   PLT 280 04/07/2019   NEUTROABS 4.2 04/07/2019    ASSESSMENT & PLAN:  Malignant neoplasm of upper-outer quadrant of left breast in female, estrogen receptor negative (HCC) Left lumpectomy 03/08/2016: IDC 2 cm, margins negative, 0/2 lymph nodes, grade 3, ER 0%, PR 0%, HER-2 negative ratio 1.31, Ki-67 40%, T1 CN 0 stage IA   Treatment plan: 1. Adjuvant chemotherapy with dose dense Adriamycin  and Cytoxan  4 followed by weekly Taxol  7  started 04/17/2016- 07/03/16 2. Followed by radiation therapy in [place] completed: September 2018 ----------------------------------------------------------------------------------------------------------------------------------------- Breast cancer surveillance 1. Breast exam 08/08/2023:  Scar tissue from prior surgeries palpable 2. Mammogram: At [facility] showed calcifications.  08/10/2022: Biopsy: Benign fat necrosis   Chemo induced peripheral Neuropathy: Currently takes gabapentin  once a day at bedtime.  I sent a new prescription today.   Knee Arthritis: Planning to get knee replacement surgery soon   RTC in 1 year for follow-up  Assessment & Plan Malignant neoplasm of upper-outer quadrant of left breast No new concerns regarding breast cancer. - Review mammogram report in the next visit.  Chemotherapy-induced peripheral neuropathy Neuropathy less severe, bothersome later in the day, managed with bedtime gabapentin . - Refill gabapentin  prescription for 90 pills with three refills. - Send prescription to [facility] in [place].      No orders of the defined types were placed in this encounter.  The patient has a good understanding of the overall plan. she agrees with it. she will call with any problems that may develop before the next visit here. Total time spent: 30 mins including face to face time and time spent for planning, charting and co-ordination of care   Viinay K Michah Minton, MD 08/08/23

## 2024-01-14 ENCOUNTER — Telehealth: Payer: Self-pay

## 2024-01-14 ENCOUNTER — Other Ambulatory Visit: Payer: Self-pay

## 2024-01-14 MED ORDER — GABAPENTIN 300 MG PO CAPS: 300.0000 mg | ORAL_CAPSULE | Freq: Two times a day (BID) | ORAL | 2 refills | Status: AC

## 2024-01-14 NOTE — Telephone Encounter (Signed)
 Received call from pt requesting to increase her Gabapentin  RX to twice a day due to increased neuropathy  in her feet. She last took the dose in 2023. Per MD RX increased. RX signs and faxed to [facility] pharmacy in [place] at [phone].

## 2024-09-10 ENCOUNTER — Ambulatory Visit: Admitting: Hematology and Oncology
